# Patient Record
Sex: Female | Born: 1985 | Race: White | Hispanic: No | Marital: Married | Smoking: Never smoker
Health system: Southern US, Community
[De-identification: ages and names within clinical notes are randomized; demographics above are authoritative.]

## PROBLEM LIST (undated history)

## (undated) DIAGNOSIS — F329 Major depressive disorder, single episode, unspecified: Secondary | ICD-10-CM

## (undated) DIAGNOSIS — E876 Hypokalemia: Secondary | ICD-10-CM

## (undated) DIAGNOSIS — IMO0002 Reserved for concepts with insufficient information to code with codable children: Secondary | ICD-10-CM

## (undated) DIAGNOSIS — B002 Herpesviral gingivostomatitis and pharyngotonsillitis: Secondary | ICD-10-CM

## (undated) DIAGNOSIS — G43909 Migraine, unspecified, not intractable, without status migrainosus: Secondary | ICD-10-CM

## (undated) DIAGNOSIS — R87619 Unspecified abnormal cytological findings in specimens from cervix uteri: Secondary | ICD-10-CM

## (undated) DIAGNOSIS — F32A Depression, unspecified: Secondary | ICD-10-CM

## (undated) HISTORY — DX: Hypomagnesemia: E83.42

## (undated) HISTORY — PX: COLPOSCOPY: SHX161

## (undated) HISTORY — DX: Hypokalemia: E87.6

## (undated) HISTORY — PX: TONSILLECTOMY: SUR1361

---

## 2006-09-22 ENCOUNTER — Ambulatory Visit: Payer: Self-pay | Admitting: Unknown Physician Specialty

## 2012-03-29 ENCOUNTER — Ambulatory Visit: Payer: Self-pay | Admitting: Family Medicine

## 2012-11-07 ENCOUNTER — Inpatient Hospital Stay: Payer: Self-pay

## 2012-11-07 LAB — CBC WITH DIFFERENTIAL/PLATELET
Basophil %: 0.3 %
Eosinophil #: 0.1 10*3/uL (ref 0.0–0.7)
Eosinophil %: 0.5 %
HGB: 13 g/dL (ref 12.0–16.0)
Lymphocyte #: 2.3 10*3/uL (ref 1.0–3.6)
MCH: 30.8 pg (ref 26.0–34.0)
MCHC: 33.5 g/dL (ref 32.0–36.0)
MCV: 92 fL (ref 80–100)
Monocyte #: 0.9 x10 3/mm (ref 0.2–0.9)
Neutrophil %: 71.6 %
RDW: 14 % (ref 11.5–14.5)

## 2014-10-16 LAB — OB RESULTS CONSOLE ABO/RH: RH Type: POSITIVE

## 2014-10-16 LAB — OB RESULTS CONSOLE HEPATITIS B SURFACE ANTIGEN: Hepatitis B Surface Ag: NEGATIVE

## 2014-10-16 LAB — OB RESULTS CONSOLE PLATELET COUNT: PLATELETS: 222 10*3/uL

## 2014-10-16 LAB — OB RESULTS CONSOLE VARICELLA ZOSTER ANTIBODY, IGG: Varicella: IMMUNE

## 2014-10-16 LAB — OB RESULTS CONSOLE HIV ANTIBODY (ROUTINE TESTING): HIV: NONREACTIVE

## 2014-10-16 LAB — OB RESULTS CONSOLE RPR: RPR: NONREACTIVE

## 2014-10-16 LAB — OB RESULTS CONSOLE HGB/HCT, BLOOD: Hemoglobin: 13.1 g/dL

## 2014-10-16 LAB — OB RESULTS CONSOLE GC/CHLAMYDIA
Chlamydia: NEGATIVE
Gonorrhea: NEGATIVE

## 2014-10-16 LAB — OB RESULTS CONSOLE RUBELLA ANTIBODY, IGM: RUBELLA: IMMUNE

## 2014-12-05 NOTE — L&D Delivery Note (Signed)
Obstetrical Delivery Note   Date of Delivery:   05/05/2015 Primary OB:   [facility] Gestational Age/EDD: 5186w2d (Dated by LMP c/w US) Antepartum complications: none  Delivered By:   Ranae Plumberhelsea Denver Harder, MD  Delivery Type:   spontaneous vaginal delivery  Procedure Details:   Patient was complete and labored down for a short time.  She began pushing and delivered through an intact perineum a liveborn female infant, with details below.  Delayed cord clamping was performed while baby was placed on maternal chest.  Cord was clamped and cut and newborn brought to warmer for deep suctioning. Small abrasion at posterior fourchette, no repair.  A portion of cord was collected in case of need for cord gas, but not used.  Placenta delivered with massage, intact.          Anesthesia:    epidural Intrapartum complications: None GBS:    Negative Laceration:    none Episiotomy:    none Placenta:    Via active 3rd stage. Intact: yes. To pathology: no.  Estimated Blood Loss:  350cc  Baby:    Liveborn female, APGARs 7/9, weight 2950gm (6lb 8oz).  Loose Body cord.  Ranae Plumberhelsea Bobby Barton, MD [facility]

## 2015-04-14 NOTE — H&P (Signed)
L&D Evaluation:  History Expanded:   HPI 29 yo G1 with EDD of 12/01/12, presents at 36 4/7 weeks with SROM at 1530 today. Pt denies feeling regular contractions, no VB, +FM. [facility].    Blood Type (Maternal) A positive    Group B Strep Results Maternal (Result >5wks must be treated as unknown) negative    Maternal HIV Negative    Maternal Syphilis Ab Nonreactive    Maternal Varicella Non-Immune    Rubella Results (Maternal) immune    Maternal T-Dap Immune    Patient's Medical History No Chronic Illness    Patient's Surgical History tonsillectomy    Medications Pre Natal Vitamins  mucinex    Allergies NKDA    Social History none   ROS:   ROS see HPI   Exam:   Vital Signs stable    General no apparent distress    Mental Status clear    Abdomen gravid, tender with contractions, beginning to feel contractions    Estimated Fetal Weight Average for gestational age    Edema no edema    Pelvic no external lesions, 2/60/-1    Mebranes Ruptured    Description clear    FHT normal rate with no decels, BL 150, moderate variability, + accelerations    Ucx regular, 3-5 min   Impression:   Impression SROM, early labor   Plan:   Plan monitor contractions and for cervical change    Comments May ambulate in hallways if desired IV medication as desired Epidural when indicated   Electronic Signatures: Vella KohlerBrothers, Derrick Orris K (CNM)  (Signed 04-Dec-13 17:17)  Authored: L&D Evaluation   Last Updated: 04-Dec-13 17:17 by Vella KohlerBrothers, Ludia Gartland K (CNM)

## 2015-04-29 LAB — OB RESULTS CONSOLE GBS
GBS: NEGATIVE
STREP GROUP B AG: NEGATIVE
STREP GROUP B AG: NEGATIVE

## 2015-05-05 ENCOUNTER — Inpatient Hospital Stay
Admission: EM | Admit: 2015-05-05 | Discharge: 2015-05-07 | DRG: 775 | Disposition: A | Discharge status: Home / Self Care | Payer: BLUE CROSS/BLUE SHIELD | Attending: Obstetrics & Gynecology | Admitting: Obstetrics & Gynecology

## 2015-05-05 ENCOUNTER — Inpatient Hospital Stay: Payer: BLUE CROSS/BLUE SHIELD | Admitting: Certified Registered"

## 2015-05-05 DIAGNOSIS — IMO0001 Reserved for inherently not codable concepts without codable children: Secondary | ICD-10-CM

## 2015-05-05 DIAGNOSIS — F329 Major depressive disorder, single episode, unspecified: Secondary | ICD-10-CM | POA: Diagnosis present

## 2015-05-05 DIAGNOSIS — Z3A37 37 weeks gestation of pregnancy: Secondary | ICD-10-CM | POA: Diagnosis present

## 2015-05-05 DIAGNOSIS — O99344 Other mental disorders complicating childbirth: Principal | ICD-10-CM | POA: Diagnosis present

## 2015-05-05 DIAGNOSIS — O09213 Supervision of pregnancy with history of pre-term labor, third trimester: Secondary | ICD-10-CM

## 2015-05-05 HISTORY — DX: Major depressive disorder, single episode, unspecified: F32.9

## 2015-05-05 HISTORY — DX: Migraine, unspecified, not intractable, without status migrainosus: G43.909

## 2015-05-05 HISTORY — DX: Unspecified abnormal cytological findings in specimens from cervix uteri: R87.619

## 2015-05-05 HISTORY — DX: Reserved for concepts with insufficient information to code with codable children: IMO0002

## 2015-05-05 HISTORY — DX: Depression, unspecified: F32.A

## 2015-05-05 HISTORY — DX: Herpesviral gingivostomatitis and pharyngotonsillitis: B00.2

## 2015-05-05 LAB — CBC
HCT: 40 % (ref 35.0–47.0)
Hemoglobin: 12.9 g/dL (ref 12.0–16.0)
MCH: 29.1 pg (ref 26.0–34.0)
MCHC: 32.1 g/dL (ref 32.0–36.0)
MCV: 90.5 fL (ref 80.0–100.0)
Platelets: 236 10*3/uL (ref 150–440)
RBC: 4.42 MIL/uL (ref 3.80–5.20)
RDW: 14.4 % (ref 11.5–14.5)
WBC: 10.3 10*3/uL (ref 3.6–11.0)

## 2015-05-05 LAB — TYPE AND SCREEN
ABO/RH(D): A POS
Antibody Screen: NEGATIVE

## 2015-05-05 LAB — ABO/RH: ABO/RH(D): A POS

## 2015-05-05 MED ORDER — BUTORPHANOL TARTRATE 1 MG/ML IJ SOLN: 1.0000 mg | INTRAMUSCULAR | Status: DC | PRN

## 2015-05-05 MED ORDER — SERTRALINE HCL 50 MG PO TABS
50.0000 mg | ORAL_TABLET | Freq: Every day | ORAL | Status: DC
  Filled 2015-05-05 (×2): qty 1

## 2015-05-05 MED ORDER — OXYTOCIN 10 UNIT/ML IJ SOLN
INTRAMUSCULAR | Status: AC
  Filled 2015-05-05: qty 2

## 2015-05-05 MED ORDER — OXYTOCIN 40 UNITS IN LACTATED RINGERS INFUSION - SIMPLE MED: 62.5000 mL/h | INTRAVENOUS | Status: DC

## 2015-05-05 MED ORDER — LANOLIN HYDROUS EX OINT: TOPICAL_OINTMENT | CUTANEOUS | Status: DC | PRN

## 2015-05-05 MED ORDER — LACTATED RINGERS IV SOLN
INTRAVENOUS | Status: DC
  Administered 2015-05-05 (×2): via INTRAVENOUS

## 2015-05-05 MED ORDER — LIDOCAINE HCL (PF) 1 % IJ SOLN
INTRAMUSCULAR | Status: AC
  Filled 2015-05-05: qty 30

## 2015-05-05 MED ORDER — IBUPROFEN 600 MG PO TABS
600.0000 mg | ORAL_TABLET | Freq: Four times a day (QID) | ORAL | Status: DC
  Administered 2015-05-06 – 2015-05-07 (×6): 600 mg via ORAL
  Filled 2015-05-05 (×6): qty 1

## 2015-05-05 MED ORDER — OXYTOCIN 40 UNITS IN LACTATED RINGERS INFUSION - SIMPLE MED
INTRAVENOUS | Status: AC
  Filled 2015-05-05: qty 1000

## 2015-05-05 MED ORDER — ONDANSETRON HCL 4 MG/2ML IJ SOLN: 4.0000 mg | INTRAMUSCULAR | Status: DC | PRN

## 2015-05-05 MED ORDER — IBUPROFEN 600 MG PO TABS
600.0000 mg | ORAL_TABLET | Freq: Four times a day (QID) | ORAL | Status: DC
Start: 2015-05-06 — End: 2015-05-05
  Administered 2015-05-05: 600 mg via ORAL
  Filled 2015-05-05: qty 1

## 2015-05-05 MED ORDER — BUPIVACAINE HCL (PF) 0.25 % IJ SOLN
INTRAMUSCULAR | Status: DC | PRN
  Administered 2015-05-05: 5 mL

## 2015-05-05 MED ORDER — PRENATAL MULTIVITAMIN CH
1.0000 | ORAL_TABLET | Freq: Every day | ORAL | Status: DC
  Administered 2015-05-06 – 2015-05-07 (×2): 1 via ORAL
  Filled 2015-05-05 (×2): qty 1

## 2015-05-05 MED ORDER — BENZOCAINE-MENTHOL 20-0.5 % EX AERO: 1.0000 "application " | INHALATION_SPRAY | CUTANEOUS | Status: DC | PRN

## 2015-05-05 MED ORDER — DIPHENHYDRAMINE HCL 25 MG PO CAPS: 25.0000 mg | ORAL_CAPSULE | Freq: Four times a day (QID) | ORAL | Status: DC | PRN

## 2015-05-05 MED ORDER — AMMONIA AROMATIC IN INHA
RESPIRATORY_TRACT | Status: AC
  Filled 2015-05-05: qty 10

## 2015-05-05 MED ORDER — WITCH HAZEL-GLYCERIN EX PADS: 1.0000 "application " | MEDICATED_PAD | CUTANEOUS | Status: DC | PRN

## 2015-05-05 MED ORDER — SIMETHICONE 80 MG PO CHEW: 80.0000 mg | CHEWABLE_TABLET | ORAL | Status: DC | PRN

## 2015-05-05 MED ORDER — MISOPROSTOL 200 MCG PO TABS
ORAL_TABLET | ORAL | Status: AC
  Filled 2015-05-05: qty 4

## 2015-05-05 MED ORDER — FENTANYL 2.5 MCG/ML W/ROPIVACAINE 0.2% IN NS 100 ML EPIDURAL INFUSION (ARMC-ANES)
EPIDURAL | Status: AC
  Administered 2015-05-05: 9 mL/h via EPIDURAL
  Filled 2015-05-05: qty 100

## 2015-05-05 MED ORDER — OXYTOCIN BOLUS FROM INFUSION: 500.0000 mL | INTRAVENOUS | Status: DC

## 2015-05-05 MED ORDER — LIDOCAINE HCL (PF) 1 % IJ SOLN
30.0000 mL | INTRAMUSCULAR | Status: DC | PRN
  Filled 2015-05-05: qty 30

## 2015-05-05 MED ORDER — ONDANSETRON HCL 4 MG/2ML IJ SOLN: 4.0000 mg | Freq: Four times a day (QID) | INTRAMUSCULAR | Status: DC | PRN

## 2015-05-05 MED ORDER — OXYTOCIN 10 UNIT/ML IJ SOLN: 10.0000 [IU] | Freq: Once | INTRAMUSCULAR | Status: DC | PRN

## 2015-05-05 MED ORDER — ONDANSETRON HCL 4 MG PO TABS: 4.0000 mg | ORAL_TABLET | ORAL | Status: DC | PRN

## 2015-05-05 MED ORDER — LACTATED RINGERS IV SOLN: 500.0000 mL | INTRAVENOUS | Status: DC | PRN

## 2015-05-05 MED ORDER — ACETAMINOPHEN 325 MG PO TABS: 650.0000 mg | ORAL_TABLET | ORAL | Status: DC | PRN

## 2015-05-05 MED ORDER — ACETAMINOPHEN 325 MG PO TABS
650.0000 mg | ORAL_TABLET | ORAL | Status: DC | PRN
  Administered 2015-05-06 – 2015-05-07 (×6): 650 mg via ORAL
  Filled 2015-05-05 (×6): qty 2

## 2015-05-05 MED ORDER — SERTRALINE HCL 50 MG PO TABS
50.0000 mg | ORAL_TABLET | Freq: Every day | ORAL | Status: DC
  Administered 2015-05-05 – 2015-05-06 (×2): 50 mg via ORAL

## 2015-05-05 MED ORDER — FENTANYL 2.5 MCG/ML W/ROPIVACAINE 0.2% IN NS 100 ML EPIDURAL INFUSION (ARMC-ANES)
2.0000 mL/h | EPIDURAL | Status: DC
  Administered 2015-05-05: 9 mL/h via EPIDURAL

## 2015-05-05 MED ORDER — DIBUCAINE 1 % RE OINT: 1.0000 "application " | TOPICAL_OINTMENT | RECTAL | Status: DC | PRN

## 2015-05-05 NOTE — Progress Notes (Signed)
Intrapartum progress note FHT rising baseline to 140, mod variability, no accels, + variable. Cervical exam: 10/100/+2 Patient does not feel contractions or urge to push with strong epidural.  Will turn down epidural rate and labor down for now. Continue to monitor closely.  CW

## 2015-05-05 NOTE — H&P (Signed)
Obstetric History and Physical  Sheri Ruiz is a 29 y.o. G2P0101 with IUP at 2967w2d presenting for SROM around 6:30am this morning. Patient states she has been having  some contractions that have been picking up in intensity, minimal vaginal bleeding, ruptured, clear fluid membranes, with active fetal movement.    Prenatal Course Source of Care: [facility]  with onset of care at 9 weeks Pregnancy complications or risks:  History of a preterm delivery in 2013 at 4663w5d. Cervical lengths have been stable, hx of depression on zoloft this pregnancy, failed 1 hr gtt with all 3 normal values on 3 hour.  Patient Active Problem List   Diagnosis Date Noted  . Labor and delivery, indication for care 05/05/2015   She plans to breastfeed She desires vasectomy for postpartum contraception.   Prenatal labs and studies: ABO, Rh: A/Positive/-- (11/12 0000) Antibody:   Rubella: Immune (11/12 0000) Varicella: immune RPR: Nonreactive (11/12 0000)  HBsAg: Negative (11/12 0000)  HIV: Non-reactive (11/12 0000)  XLK:GMWNUUVOGBS:Negative 1 hr Glucola  141 Genetic screening NA Anatomy US normal Tdap: given   Flu: not given    Past Medical History  Diagnosis Date  . Preterm delivery   . Depression   . Oral herpes   . Abnormal Pap smear of cervix   . LGSIL (low grade squamous intraepithelial dysplasia)   . Migraine     Past Surgical History  Procedure Laterality Date  . Colposcopy    . Tonsillectomy      OB History  Gravida Para Term Preterm AB SAB TAB Ectopic Multiple Living  2 1 0 1 0 0 0 0 0 1     # Outcome Date GA Lbr Len/2nd Weight Sex Delivery Anes PTL Lv  2 Current           1 Preterm 2013 7563w5d   M          History   Social History  . Marital Status: Single    Spouse Name: N/A  . Number of Children: N/A  . Years of Education: N/A   Social History Main Topics  . Smoking status: Never Smoker   . Smokeless tobacco: Not on file  . Alcohol Use: No  . Drug Use: No  . Sexual Activity:  Yes    Birth Control/ Protection: None   Other Topics Concern  . None   Social History Narrative  . None    History reviewed. No pertinent family history.  Prescriptions prior to admission  Medication Sig Dispense Refill Last Dose  . Prenatal Vit-Fe Fumarate-FA (MULTIVITAMIN-PRENATAL) 27-0.8 MG TABS tablet Take 1 tablet by mouth daily at 12 noon.   05/05/2015 at Unknown time  . sertraline (ZOLOFT) 50 MG tablet Take 50 mg by mouth daily.   05/04/2015 at Unknown time  . valACYclovir (VALTREX) 1000 MG tablet Take 1,000 mg by mouth 2 (two) times daily.     . valACYclovir (VALTREX) 500 MG tablet Take 500 mg by mouth.     pt not currently taking valtrex at the moment. Takes them PRN for cold sores.   Allergies  Allergen Reactions  . Amoxicillin Rash    Review of Systems: Negative except for what is mentioned in HPI.  Physical Exam: BP 116/65 mmHg  Pulse 80  Temp(Src) 98.2 F (36.8 C) (Oral)  LMP 08/17/2014 (Approximate) GENERAL: Well-developed, well-nourished female in no acute distress.  LUNGS: Clear to auscultation bilaterally.  HEART: Regular rate and rhythm. ABDOMEN: Soft, nontender, nondistended, gravid. EXTREMITIES: Nontender, no edema,  2+ distal pulses. Dilation: 3.5 Station: -2 Presentation: Vertex Exam by:: c Angelos Wasco  3.5/60/-2  Grossly ruptured for clear fluid Presentation: cephalic FHT:  Baseline rate 145 bpm   Variability moderate  Accelerations present   Decelerations none Contractions: Every 2-3  mins  Pertinent Labs/Studies:   No results found for this or any previous visit (from the past 24 hour(s)).  Assessment : Sheri Ruiz is a 29 y.o. G2P0101 at [redacted]w[redacted]d being admitted for labor and SROM at 0630 am.  Plan: Labor: Expectant management.  Induction/Augmentation as needed, per protocol FWB: Reassuring fetal heart tracing.   GBS negative Pt may possibly want epidural- IV stadol as well for pain  Delivery plan: Hopeful for vaginal  delivery  Jannet Mantis, CNM [facility]

## 2015-05-05 NOTE — OB Triage Note (Signed)
Pt presents to l/d with c/o ruptured membranes at 0600 this am. Pt placed on efm and pt checked with nitrazine . nitrazine negative. Courtney subudhi,cnm notofied

## 2015-05-05 NOTE — Anesthesia Procedure Notes (Signed)
Epidural Patient location during procedure: OB Start time: 05/05/2015 2:45 PM End time: 05/05/2015 3:17 PM  Staffing Anesthesiologist: Yves DillARROLL, PAUL Resident/CRNA: Mathews ArgyleLOGAN, Dent Plantz Performed by: resident/CRNA   Preanesthetic Checklist Completed: patient identified, site marked, pre-op evaluation, timeout performed, IV checked, risks and benefits discussed and monitors and equipment checked  Epidural Patient position: sitting Prep: Betadine and site prepped and draped Patient monitoring: heart rate, cardiac monitor, continuous pulse ox and blood pressure Approach: midline Location: L3-L4 Injection technique: LOR saline  Needle:  Needle type: Tuohy  Needle gauge: 18 G Needle length: 9 cm Needle insertion depth: 6 cm Catheter type: closed end flexible Catheter size: 20 Guage Catheter at skin depth: 11 cm Test dose: negative and 1.5% lidocaine with Epi 1:200 K  Assessment Events: blood not aspirated, injection not painful, no injection resistance, negative IV test and no paresthesia  Additional Notes Reason for block:procedure for pain

## 2015-05-05 NOTE — Anesthesia Preprocedure Evaluation (Addendum)
Anesthesia Evaluation  Patient identified by MRN, date of birth, ID band Patient awake    Reviewed: Allergy & Precautions, NPO status , Patient's Chart, lab work & pertinent test results  Airway Mallampati: I  TM Distance: >3 FB Neck ROM: Full    Dental  (+) Teeth Intact   Pulmonary neg pulmonary ROS,    Pulmonary exam normal       Cardiovascular negative cardio ROS Normal cardiovascular examRhythm:Regular Rate:Normal     Neuro/Psych  Headaches, Depression    GI/Hepatic Neg liver ROS, GERD-  Controlled,  Endo/Other  negative endocrine ROS  Renal/GU negative Renal ROS  negative genitourinary   Musculoskeletal negative musculoskeletal ROS (+)   Abdominal Normal abdominal exam  (+)   Peds  Hematology negative hematology ROS (+)   Anesthesia Other Findings   Reproductive/Obstetrics negative OB ROS                            Anesthesia Physical Anesthesia Plan  ASA: II  Anesthesia Plan: Epidural   Post-op Pain Management:    Induction:   Airway Management Planned:   Additional Equipment:   Intra-op Plan:   Post-operative Plan:   Informed Consent: I have reviewed the patients History and Physical, chart, labs and discussed the procedure including the risks, benefits and alternatives for the proposed anesthesia with the patient or authorized representative who has indicated his/her understanding and acceptance.     Plan Discussed with:   Anesthesia Plan Comments:         Anesthesia Quick Evaluation

## 2015-05-05 NOTE — Discharge Summary (Signed)
Obstetrical Discharge Summary  Date of Admission: 05/05/2015 Date of Discharge: 05/05/2015  Primary OB: [facility]  Gestational Age at Delivery: 8132w2d   Antepartum complications: HSV suppression, depression taking zoloft Reason for Admission: SROM Date of Delivery: 05/05/2015 Delivered By: Ranae Plumberhelsea Ward, MD Delivery Type: spontaneous vaginal delivery Intrapartum complications/course: Expectant management, epidural, Category 2 tracing with variables. Anesthesia: epidural Placenta: spontaneous Laceration: none Episiotomy: none Baby: Liveborn female, APGARs7/9, weight 2950 g. (6lb 8oz)   Post partum course: Uncomplicated postpartum course Disposition: Home  Rh Immune globulin given: no Rubella vaccine given: no Tdap vaccine given in AP or PP setting: yes Flu vaccine given in AP or PP setting: yes  Feeding: breast Contraception:vasectomy  Prenatal Labs:  A+, RI, VI, HbsAg neg, HIV neg, RPR nr, GBS neg   Plan:  Sheri Ruiz was discharged to home in good condition. [facility] with Dr. Elesa MassedWard in 1 week  No future appointments.  Discharge Medications:   Medication List    ASK your doctor about these medications        multivitamin-prenatal 27-0.8 MG Tabs tablet  Take 1 tablet by mouth daily at 12 noon.     sertraline 50 MG tablet  Commonly known as:  ZOLOFT  Take 50 mg by mouth daily.     valACYclovir 1000 MG tablet  Commonly known as:  VALTREX  Take 1,000 mg by mouth 2 (two) times daily.     valACYclovir 500 MG tablet  Commonly known as:  VALTREX  Take 500 mg by mouth.        Ranae Plumberhelsea Ward, MD Attending Obstetrician/Gynecologist [facility]

## 2015-05-05 NOTE — Progress Notes (Signed)
Intrapartum progress note  Patient comfortable, with epidural.  Mild nausea.  No other complaints.  VS WNL. FHT: 135,mod, +accels + variables Toco: q2-535mins, no pit Cervix: 6-7/90/-1 per RN  A/P:  28yo G2P0101 @ 37.2 with SROM 1. FHT shows no signs of fetal compromise or acidemia as variability is moderate and presence of accelerations. Category 2 strip, no need for intervention at this time. 2. Continue expectant management  Ranae Plumberhelsea Johnluke Haugen, MD Attending Obstetrician and Gynecologist [facility]

## 2015-05-06 LAB — CBC
HEMATOCRIT: 37.8 % (ref 35.0–47.0)
HEMOGLOBIN: 12.5 g/dL (ref 12.0–16.0)
MCH: 29.8 pg (ref 26.0–34.0)
MCHC: 32.9 g/dL (ref 32.0–36.0)
MCV: 90.4 fL (ref 80.0–100.0)
Platelets: 220 10*3/uL (ref 150–440)
RBC: 4.18 MIL/uL (ref 3.80–5.20)
RDW: 14.2 % (ref 11.5–14.5)
WBC: 13.8 10*3/uL — ABNORMAL HIGH (ref 3.6–11.0)

## 2015-05-06 LAB — RPR: RPR Ser Ql: NONREACTIVE

## 2015-05-06 NOTE — Progress Notes (Signed)
Post Partum Day 1 from a SVD Subjective: no complaints, up ad lib, voiding, tolerating PO and breastfeeding going well.   Objective: Filed Vitals:   05/05/15 2145 05/05/15 2310 05/06/15 0310 05/06/15 0723  BP:  116/63 95/54 104/63  Pulse:  90 91 95  Temp:  98.3 F (36.8 C) 97.8 F (36.6 C) 97.4 F (36.3 C)  TempSrc:  Oral Oral Oral  Resp:  18 18 14   Height: 5\' 2"  (1.575 m)     Weight: 88.451 kg (195 lb)     SpO2:    100%   Physical Exam:  General: alert, cooperative and no distress Lochia: appropriate Uterine Fundus: firm Incision: NA DVT Evaluation: No evidence of DVT seen on physical exam.   Recent Labs  05/05/15 1115 05/06/15 0616  HGB 12.9 12.5  HCT 40.0 37.8    Assessment/Plan: Plan for discharge tomorrow and Breastfeeding   LOS: 1 day   Jannet MantisSubudhi,  Kaytlen Lightsey 05/06/2015, 9:38 AM

## 2015-05-06 NOTE — Anesthesia Postprocedure Evaluation (Signed)
  Anesthesia Post-op Note  Patient: Sheri KosKristyn S Ruiz  Procedure(s) Performed: * No procedures listed *  Patient Location: Mother/Baby 338  Anesthesia Type:Epidural  Level of Consciousness: awake, alert  and oriented  Airway and Oxygen Therapy: Patient Spontanous Breathing  Post-op Pain: 0 /10  Post-op Assessment: Post-op Vital signs reviewed, Patent Airway, No signs of Nausea or vomiting and Pain level controlled  Post-op Vital Signs: Reviewed and stable  Last Vitals:  Filed Vitals:   05/06/15 0723  BP: 104/63  Pulse: 95  Temp: 36.3 C  Resp: 14    Complications: No apparent anesthesia complications

## 2015-05-07 MED ORDER — IBUPROFEN 600 MG PO TABS: 600.0000 mg | ORAL_TABLET | Freq: Four times a day (QID) | ORAL | Status: DC

## 2015-05-07 NOTE — Progress Notes (Signed)
Pt discharged home with infant. Discharge teaching completed.  

## 2016-02-04 LAB — HM PAP SMEAR: HM PAP: NORMAL

## 2016-04-01 ENCOUNTER — Emergency Department
Admission: EM | Admit: 2016-04-01 | Discharge: 2016-04-01 | Disposition: A | Discharge status: Home / Self Care | Payer: BLUE CROSS/BLUE SHIELD | Attending: Emergency Medicine | Admitting: Emergency Medicine

## 2016-04-01 ENCOUNTER — Emergency Department: Payer: BLUE CROSS/BLUE SHIELD

## 2016-04-01 ENCOUNTER — Encounter: Payer: Self-pay | Admitting: Emergency Medicine

## 2016-04-01 DIAGNOSIS — Z791 Long term (current) use of non-steroidal anti-inflammatories (NSAID): Secondary | ICD-10-CM | POA: Diagnosis not present

## 2016-04-01 DIAGNOSIS — R0789 Other chest pain: Secondary | ICD-10-CM | POA: Diagnosis not present

## 2016-04-01 DIAGNOSIS — R079 Chest pain, unspecified: Secondary | ICD-10-CM

## 2016-04-01 DIAGNOSIS — R51 Headache: Secondary | ICD-10-CM

## 2016-04-01 DIAGNOSIS — Z79899 Other long term (current) drug therapy: Secondary | ICD-10-CM | POA: Diagnosis not present

## 2016-04-01 DIAGNOSIS — R519 Headache, unspecified: Secondary | ICD-10-CM

## 2016-04-01 DIAGNOSIS — F329 Major depressive disorder, single episode, unspecified: Secondary | ICD-10-CM | POA: Diagnosis not present

## 2016-04-01 MED ORDER — DIPHENHYDRAMINE HCL 50 MG/ML IJ SOLN
25.0000 mg | Freq: Once | INTRAMUSCULAR | Status: AC
  Administered 2016-04-01: 25 mg via INTRAVENOUS
  Filled 2016-04-01: qty 1

## 2016-04-01 MED ORDER — GI COCKTAIL ~~LOC~~
30.0000 mL | Freq: Once | ORAL | Status: AC
  Administered 2016-04-01: 30 mL via ORAL
  Filled 2016-04-01: qty 30

## 2016-04-01 MED ORDER — BUTALBITAL-APAP-CAFFEINE 50-325-40 MG PO TABS: 1.0000 | ORAL_TABLET | Freq: Four times a day (QID) | ORAL | Status: AC | PRN

## 2016-04-01 MED ORDER — METOCLOPRAMIDE HCL 5 MG/ML IJ SOLN
20.0000 mg | Freq: Once | INTRAVENOUS | Status: AC
  Administered 2016-04-01: 20 mg via INTRAVENOUS
  Filled 2016-04-01 (×2): qty 4

## 2016-04-01 NOTE — Discharge Instructions (Signed)

## 2016-04-01 NOTE — ED Notes (Signed)
Patient transported to CT 

## 2016-04-01 NOTE — ED Notes (Signed)
Pt. States [facility] for "shooting pain in the back of head".  Pt. States she [facility] and after shot pt. States tingling sensation to both arms and across chest.  Pt. States hx of Migraines.

## 2016-04-01 NOTE — ED Provider Notes (Signed)
[facility] Emergency Department Provider Note  ____________________________________________    I have reviewed the triage vital signs and the nursing notes.   HISTORY  Chief Complaint Chest Pain    HPI Sheri Ruiz is a 30 y.o. female who presents with chest pain. Patient reports that she went to go to clinic walk-in for migraine symptoms. She reports a history of migraines and reports her symptoms were similar. They gave her a shot of Toradol IM and approximately 15 minutes after getting the shot she felt like she was having a panic attack and felt tightness in her chest. She came to the emergency department and is feeling definitely better now although she continues to have some discomfort in her head posteriorly. This is typically where her migraine symptoms manifest. No neuro deficits. No fevers. No neck pain     Past Medical History  Diagnosis Date  . Preterm delivery   . Depression   . Oral herpes   . Abnormal Pap smear of cervix   . LGSIL (low grade squamous intraepithelial dysplasia)   . Migraine     Patient Active Problem List   Diagnosis Date Noted  . Labor and delivery, indication for care 05/05/2015  . Status post normal vaginal delivery 05/05/2015    Past Surgical History  Procedure Laterality Date  . Colposcopy    . Tonsillectomy      Current Outpatient Rx  Name  Route  Sig  Dispense  Refill  . butalbital-acetaminophen-caffeine (FIORICET) 50-325-40 MG tablet   Oral   Take 1-2 tablets by mouth every 6 (six) hours as needed for headache.   20 tablet   0   . ibuprofen (ADVIL,MOTRIN) 600 MG tablet   Oral   Take 1 tablet (600 mg total) by mouth every 6 (six) hours.   30 tablet   0   . Prenatal Vit-Fe Fumarate-FA (MULTIVITAMIN-PRENATAL) 27-0.8 MG TABS tablet   Oral   Take 1 tablet by mouth daily.          . sertraline (ZOLOFT) 50 MG tablet   Oral   Take 50 mg by mouth daily.            Allergies Amoxicillin  Family History  Problem Relation Age of Onset  . Stroke Mother     Social History Social History  Substance Use Topics  . Smoking status: Never Smoker   . Smokeless tobacco: None  . Alcohol Use: No    Review of Systems  Constitutional: Negative for fever. Eyes: Negative for redness ENT: Negative for neck pain Cardiovascular: As above Respiratory: Negative for shortness of breath. Gastrointestinal: Negative for abdominal pain Genitourinary: Negative for dysuria. Musculoskeletal: Negative for back pain. Skin: Negative for rash. Neurological: Negative for focal weakness, no dizziness Psychiatric: no anxiety    ____________________________________________   PHYSICAL EXAM:  VITAL SIGNS: ED Triage Vitals  Enc Vitals Group     BP 04/01/16 1748 120/71 mmHg     Pulse Rate 04/01/16 1748 77     Resp 04/01/16 1748 18     Temp 04/01/16 1748 97.7 F (36.5 C)     Temp Source 04/01/16 1748 Oral     SpO2 04/01/16 1748 100 %     Weight 04/01/16 1748 171 lb (77.565 kg)     Height 04/01/16 1748 5\' 2"  (1.575 m)     Head Cir --      Peak Flow --      Pain Score 04/01/16 1752 4  Pain Loc --      Pain Edu? --      Excl. in GC? --      Constitutional: Alert and oriented. Well appearing and in no distress.  Eyes: Conjunctivae are normal. No erythema or injection, PERRLA, EOMI ENT   Head: Normocephalic and atraumatic.   Mouth/Throat: Mucous membranes are moist. No meningeal signs Cardiovascular: Normal rate, regular rhythm. Normal and symmetric distal pulses are present in the upper extremities.  Respiratory: Normal respiratory effort without tachypnea nor retractions. Breath sounds are clear and equal bilaterally.  Gastrointestinal: Soft and non-tender in all quadrants. No distention. There is no CVA tenderness. Genitourinary: deferred Musculoskeletal: Nontender with normal range of motion in all extremities. No lower extremity tenderness  nor edema. Neurologic:  Normal speech and language. No gross focal neurologic deficits are appreciated. Skin:  Skin is warm, dry and intact. No rash noted. Psychiatric: Mood and affect are normal. Patient exhibits appropriate insight and judgment.  ____________________________________________    LABS (pertinent positives/negatives)  Labs Reviewed - No data to display  ____________________________________________   EKG  ED ECG REPORT I, Jene Every, the attending physician, personally viewed and interpreted this ECG.  Date: 04/01/2016 EKG Time: 5:48 PM Rate: 79 Rhythm: normal sinus rhythm QRS Axis: normal Intervals: normal ST/T Wave abnormalities: normal Conduction Disturbances: none Narrative Interpretation: unremarkable   ____________________________________________    RADIOLOGY  CT head unremarkable  ____________________________________________   PROCEDURES  Procedure(s) performed: none  Critical Care performed: none  ____________________________________________   INITIAL IMPRESSION / ASSESSMENT AND PLAN / ED COURSE  Pertinent labs & imaging results that were available during my care of the patient were reviewed by me and considered in my medical decision making (see chart for details).  Patient had resolution of chest pain after GI cocktail. Her EKG is unremarkable. We will give IV Reglan and Benadryl for her migraine and obtain CT head.  CT is unremarkable. Patient is feeling significantly better after treatment. She is anxious to go home. I feel that this is reasonable given no neuro deficits, no fever, no meningeal signs. Symptoms consistent with her typical migraine. Return precautions discussed.  ____________________________________________   FINAL CLINICAL IMPRESSION(S) / ED DIAGNOSES  Final diagnoses:  Acute nonintractable headache, unspecified headache type  Chest pain, unspecified chest pain type          Jene Every,  MD 04/01/16 2246

## 2016-04-01 NOTE — ED Notes (Signed)
Pharmacy called regarding Reglan, will send to ED.

## 2016-07-20 ENCOUNTER — Encounter: Payer: Self-pay | Admitting: Primary Care

## 2016-07-20 ENCOUNTER — Ambulatory Visit (INDEPENDENT_AMBULATORY_CARE_PROVIDER_SITE_OTHER): Payer: BLUE CROSS/BLUE SHIELD | Admitting: Primary Care

## 2016-07-20 VITALS — BP 118/72 | HR 75 | Temp 97.6°F | Ht 63.0 in | Wt 180.0 lb

## 2016-07-20 DIAGNOSIS — N943 Premenstrual tension syndrome: Secondary | ICD-10-CM | POA: Diagnosis not present

## 2016-07-20 DIAGNOSIS — B001 Herpesviral vesicular dermatitis: Secondary | ICD-10-CM | POA: Insufficient documentation

## 2016-07-20 DIAGNOSIS — F411 Generalized anxiety disorder: Secondary | ICD-10-CM

## 2016-07-20 DIAGNOSIS — G43821 Menstrual migraine, not intractable, with status migrainosus: Secondary | ICD-10-CM

## 2016-07-20 DIAGNOSIS — G43909 Migraine, unspecified, not intractable, without status migrainosus: Secondary | ICD-10-CM | POA: Insufficient documentation

## 2016-07-20 MED ORDER — HYDROXYZINE HCL 25 MG PO TABS: 25.0000 mg | ORAL_TABLET | Freq: Two times a day (BID) | ORAL | 0 refills | Status: DC | PRN

## 2016-07-20 MED ORDER — SERTRALINE HCL 100 MG PO TABS
100.0000 mg | ORAL_TABLET | Freq: Every day | ORAL | 1 refills | Status: DC
Start: 2016-07-20 — End: 2016-09-14

## 2016-07-20 NOTE — Assessment & Plan Note (Signed)
Long history of, uses Valtrex as needed with improvement. Continue current regimen.

## 2016-07-20 NOTE — Assessment & Plan Note (Signed)
Diagnosed several years ago and is currently managed on Zoloft 50 mg. GAD 7 score 12 today. Denies SI/HI. Given increased symptoms over the past 6 months, we will increase Zoloft to 100 mg once daily. Prescription for hydroxyzine provided to use as needed for anxiety outbursts. Follow-up in 6 weeks for further evaluation.

## 2016-07-20 NOTE — Assessment & Plan Note (Signed)
Long history of, related to menstrual cycle. Currently working with GYN for IUD placement in hopes of reduction in frequency of migraines. Uses Fioricet occasionally.

## 2016-07-20 NOTE — Progress Notes (Signed)
Pre visit review using our clinic review tool, if applicable. No additional management support is needed unless otherwise documented below in the visit note. 

## 2016-07-20 NOTE — Progress Notes (Signed)
Subjective:    Patient ID: Sheri Ruiz, female    DOB: 03-04-1986, 30 y.o.   MRN: [id number]  HPI  Sheri Ruiz is a 30 year old female who presents today to establish care and discuss the problems mentioned below. Will obtain old records.  1) Generalized Anxiety Disorder: Diagnosed several years ago and is currently managed on Zoloft 50 mg. She did try to come off of her medication in the past but realized that she felt her anxiety was better controlled with medication. Over the past 6 months she's noticed irritability, increased anxiety, decreased sleep. GAD 7 score of 12 today. Denies SI/HI. She experiences increased bursts of anxiety several times weekly and thinks she may be experiencing panic attacks.   2) Frequent Headaches/Migraines: Currently managed on Fioricet for which she takes occasionally for migraines. Migraines typically occur during her menstrual cycle. She is working with GYN and now has an IUD that she had inserted in June 2017. She will experience migraines once monthly on average, but has noticed a slight improvement. She will experience loss of peripheral vision, photophobia, and occasional nausea. Overall the Fioricet does well to manage symptoms.   3) Cold Sores: Long history of and is mangaed on Valtrex that she takes as needed with improvement. She has noticed more frequent breakouts over the past 6 months with her increase in anxiety.  Review of Systems  HENT:       History of herpes labialis  Eyes:       Does experience photophobia with migraines  Respiratory: Negative for shortness of breath.   Cardiovascular: Negative for chest pain.       Occasional palpitations with increased bursts of anxiety  Genitourinary:       IUD placed per GYN  Neurological:       Headaches overall stable.  Psychiatric/Behavioral: Positive for sleep disturbance. Negative for suicidal ideas. The patient is nervous/anxious.        Past Medical History:  Diagnosis Date  .  Abnormal Pap smear of cervix   . Depression   . LGSIL (low grade squamous intraepithelial dysplasia)   . Migraine   . Oral herpes   . Preterm delivery      Social History   Social History  . Marital status: Married    Spouse name: N/A  . Number of children: N/A  . Years of education: N/A   Occupational History  . Not on file.   Social History Main Topics  . Smoking status: Never Smoker  . Smokeless tobacco: Not on file  . Alcohol use No  . Drug use: No  . Sexual activity: Yes    Birth control/ protection: None   Other Topics Concern  . Not on file   Social History Narrative   Married.   2 children.   Works as a Social workerhair stylist.   Enjoys spending time with family.     Past Surgical History:  Procedure Laterality Date  . COLPOSCOPY    . TONSILLECTOMY      Family History  Problem Relation Age of Onset  . Transient ischemic attack Mother   . Hypertension Maternal Grandmother   . Clotting disorder Maternal Grandmother     Allergies  Allergen Reactions  . Amoxicillin Rash and Nausea And Vomiting    Current Outpatient Prescriptions on File Prior to Visit  Medication Sig Dispense Refill  . butalbital-acetaminophen-caffeine (FIORICET) 50-325-40 MG tablet Take 1-2 tablets by mouth every 6 (six) hours as needed for headache. (  Patient not taking: Reported on 07/20/2016) 20 tablet 0   No current facility-administered medications on file prior to visit.     BP 118/72   Pulse 75   Temp 97.6 F (36.4 C) (Oral)   Ht 5\' 3"  (1.6 m)   Wt 180 lb (81.6 kg)   SpO2 98%   BMI 31.89 kg/m    Objective:   Physical Exam  Constitutional: She appears well-nourished.  Eyes: EOM are normal. Pupils are equal, round, and reactive to light.  Neck: Neck supple.  Cardiovascular: Normal rate and regular rhythm.   Pulmonary/Chest: Effort normal and breath sounds normal.  Neurological: No cranial nerve deficit.  Skin: Skin is warm and dry.  Psychiatric: She has a normal mood and  affect.          Assessment & Plan:

## 2016-07-20 NOTE — Patient Instructions (Signed)
We've increased the dose of your Zoloft from 50 mg to 100 mg. If you have several tablets remaining of Zoloft 50 mg, you may take 2 tablets. I have sent a new prescription for 100 mg. Once you receive this, take 1 tablet by mouth once daily.  You may use the hydroxyzine as needed for breakthrough anxiety. Take 1 tablet by mouth up to twice daily as needed. Caution as this medication may cause drowsiness.   Follow up with me in 4-6 weeks for re-evaluation of anxiety.  It was a pleasure to meet you today! Please don't hesitate to call me with any questions. Welcome to [facility]!

## 2016-08-15 ENCOUNTER — Encounter: Payer: Self-pay | Admitting: Primary Care

## 2016-08-18 ENCOUNTER — Ambulatory Visit (INDEPENDENT_AMBULATORY_CARE_PROVIDER_SITE_OTHER): Payer: BLUE CROSS/BLUE SHIELD | Admitting: Primary Care

## 2016-08-18 ENCOUNTER — Encounter: Payer: Self-pay | Admitting: Primary Care

## 2016-08-18 DIAGNOSIS — F411 Generalized anxiety disorder: Secondary | ICD-10-CM | POA: Diagnosis not present

## 2016-08-18 NOTE — Assessment & Plan Note (Signed)
Improved on increased dose of Zoloft. Has not needed hydroxyzine. Continue Zoloft 100 mg tablets. Will continue to monitor.

## 2016-08-18 NOTE — Patient Instructions (Signed)
Continue Zoloft 100 mg tablets for anxiety.  You may take the hydroxyzine as needed for any breakthrough anxiety/panic attacks.  It was a pleasure to see you today!

## 2016-08-18 NOTE — Progress Notes (Signed)
Pre visit review using our clinic review tool, if applicable. No additional management support is needed unless otherwise documented below in the visit note. 

## 2016-08-18 NOTE — Progress Notes (Signed)
Subjective:    Patient ID: Sheri Ruiz, female    DOB: 1986-10-01, 30 y.o.   MRN: [id number]  HPI  [name] is a 30 year old female who presents today for follow up of Generalized Anxiety Disorder. She presented as a new patient on 08/16 with complaints of anxiety with a GAD 7 score of 12. She was currently managed on Zoloft 50 mg but had noticed increased symptoms of anxiety for the previous 6 months. After evaluation and assessment her Zoloft was increased to 100 mg and she was provided with a prescription for hydroxyzine to use PRN for panic attacks.  Since her last visit she's feeling much improved. She's noticed increased patience with her children, denies anger outbursts, denies panic attacks. She's not had to use the hydroxyzine as she's not experienced panic attacks. Denies SI/HI, GI upset. She has noticed intermittent dizziness but will occur once to twice weekly, this will dissipate quickly and does not cause disruption.  Review of Systems  Respiratory: Negative for shortness of breath.   Cardiovascular: Negative for chest pain.  Gastrointestinal: Negative for abdominal pain and nausea.  Neurological: Positive for dizziness.  Psychiatric/Behavioral: Negative for sleep disturbance and suicidal ideas. The patient is not nervous/anxious.        Past Medical History:  Diagnosis Date  . Abnormal Pap smear of cervix   . Depression   . LGSIL (low grade squamous intraepithelial dysplasia)   . Migraine   . Oral herpes   . Preterm delivery      Social History   Social History  . Marital status: Married    Spouse name: N/A  . Number of children: N/A  . Years of education: N/A   Occupational History  . Not on file.   Social History Main Topics  . Smoking status: Never Smoker  . Smokeless tobacco: Not on file  . Alcohol use No  . Drug use: No  . Sexual activity: Yes    Birth control/ protection: None   Other Topics Concern  . Not on file   Social History  Narrative   Married.   2 children.   Works as a Social workerhair stylist.   Enjoys spending time with family.     Past Surgical History:  Procedure Laterality Date  . COLPOSCOPY    . TONSILLECTOMY      Family History  Problem Relation Age of Onset  . Transient ischemic attack Mother   . Hypertension Maternal Grandmother   . Clotting disorder Maternal Grandmother     Allergies  Allergen Reactions  . Amoxicillin Rash and Nausea And Vomiting    Current Outpatient Prescriptions on File Prior to Visit  Medication Sig Dispense Refill  . hydrOXYzine (ATARAX/VISTARIL) 25 MG tablet Take 1 tablet (25 mg total) by mouth 2 (two) times daily as needed for anxiety. 30 tablet 0  . sertraline (ZOLOFT) 100 MG tablet Take 1 tablet (100 mg total) by mouth daily. 30 tablet 1  . valACYclovir (VALTREX) 500 MG tablet Take 500 mg by mouth 2 (two) times daily.    . butalbital-acetaminophen-caffeine (FIORICET) 50-325-40 MG tablet Take 1-2 tablets by mouth every 6 (six) hours as needed for headache. (Patient not taking: Reported on 08/18/2016) 20 tablet 0   No current facility-administered medications on file prior to visit.     BP 102/64   Pulse 71   Temp 98.3 F (36.8 C) (Oral)   Ht 5\' 3"  (1.6 m)   Wt 180 lb 1.9 oz (81.7  kg)   SpO2 96%   BMI 31.91 kg/m    Objective:   Physical Exam  Constitutional: She appears well-nourished.  Cardiovascular: Normal rate and regular rhythm.   Pulmonary/Chest: Effort normal and breath sounds normal.  Skin: Skin is warm and dry.  Psychiatric: She has a normal mood and affect.          Assessment & Plan:

## 2016-09-14 ENCOUNTER — Other Ambulatory Visit: Payer: Self-pay | Admitting: Primary Care

## 2016-09-14 DIAGNOSIS — F411 Generalized anxiety disorder: Secondary | ICD-10-CM

## 2016-11-18 ENCOUNTER — Other Ambulatory Visit: Payer: Self-pay | Admitting: Primary Care

## 2016-11-18 DIAGNOSIS — F411 Generalized anxiety disorder: Secondary | ICD-10-CM

## 2017-01-15 ENCOUNTER — Observation Stay
Admission: EM | Admit: 2017-01-15 | Discharge: 2017-01-15 | Disposition: A | Discharge status: Home / Self Care | Payer: BLUE CROSS/BLUE SHIELD | Attending: Internal Medicine | Admitting: Internal Medicine

## 2017-01-15 ENCOUNTER — Emergency Department: Payer: BLUE CROSS/BLUE SHIELD

## 2017-01-15 ENCOUNTER — Observation Stay (HOSPITAL_BASED_OUTPATIENT_CLINIC_OR_DEPARTMENT_OTHER)
Admit: 2017-01-15 | Discharge: 2017-01-15 | Disposition: A | Discharge status: Home / Self Care | Payer: BLUE CROSS/BLUE SHIELD | Attending: Cardiovascular Disease | Admitting: Cardiovascular Disease

## 2017-01-15 ENCOUNTER — Encounter: Payer: Self-pay | Admitting: Emergency Medicine

## 2017-01-15 DIAGNOSIS — Z880 Allergy status to penicillin: Secondary | ICD-10-CM | POA: Insufficient documentation

## 2017-01-15 DIAGNOSIS — R748 Abnormal levels of other serum enzymes: Secondary | ICD-10-CM | POA: Diagnosis not present

## 2017-01-15 DIAGNOSIS — I471 Supraventricular tachycardia, unspecified: Secondary | ICD-10-CM | POA: Diagnosis present

## 2017-01-15 DIAGNOSIS — F329 Major depressive disorder, single episode, unspecified: Secondary | ICD-10-CM | POA: Diagnosis not present

## 2017-01-15 DIAGNOSIS — R0789 Other chest pain: Secondary | ICD-10-CM | POA: Diagnosis not present

## 2017-01-15 DIAGNOSIS — R7989 Other specified abnormal findings of blood chemistry: Secondary | ICD-10-CM | POA: Diagnosis not present

## 2017-01-15 DIAGNOSIS — G43821 Menstrual migraine, not intractable, with status migrainosus: Secondary | ICD-10-CM

## 2017-01-15 DIAGNOSIS — B001 Herpesviral vesicular dermatitis: Secondary | ICD-10-CM | POA: Diagnosis not present

## 2017-01-15 DIAGNOSIS — Z79899 Other long term (current) drug therapy: Secondary | ICD-10-CM | POA: Diagnosis not present

## 2017-01-15 DIAGNOSIS — I361 Nonrheumatic tricuspid (valve) insufficiency: Secondary | ICD-10-CM | POA: Diagnosis not present

## 2017-01-15 DIAGNOSIS — R000 Tachycardia, unspecified: Secondary | ICD-10-CM | POA: Diagnosis present

## 2017-01-15 DIAGNOSIS — E876 Hypokalemia: Secondary | ICD-10-CM | POA: Insufficient documentation

## 2017-01-15 DIAGNOSIS — F411 Generalized anxiety disorder: Secondary | ICD-10-CM | POA: Insufficient documentation

## 2017-01-15 DIAGNOSIS — R778 Other specified abnormalities of plasma proteins: Secondary | ICD-10-CM

## 2017-01-15 LAB — COMPREHENSIVE METABOLIC PANEL
ALBUMIN: 3.4 g/dL — AB (ref 3.5–5.0)
ALT: 17 U/L (ref 14–54)
AST: 19 U/L (ref 15–41)
Alkaline Phosphatase: 58 U/L (ref 38–126)
Anion gap: 8 (ref 5–15)
BILIRUBIN TOTAL: 0.5 mg/dL (ref 0.3–1.2)
BUN: 20 mg/dL (ref 6–20)
CHLORIDE: 111 mmol/L (ref 101–111)
CO2: 23 mmol/L (ref 22–32)
CREATININE: 0.58 mg/dL (ref 0.44–1.00)
Calcium: 8 mg/dL — ABNORMAL LOW (ref 8.9–10.3)
GFR calc Af Amer: >60 mL/min (ref >60)
GFR calc non Af Amer: >60 mL/min (ref >60)
GLUCOSE: 114 mg/dL — AB (ref 65–99)
POTASSIUM: 3.3 mmol/L — AB (ref 3.5–5.1)
Sodium: 142 mmol/L (ref 135–145)
Total Protein: 6 g/dL — ABNORMAL LOW (ref 6.5–8.1)

## 2017-01-15 LAB — TSH
TSH: 3.307 u[IU]/mL (ref 0.350–4.500)
TSH: 3.22 u[IU]/mL (ref 0.350–4.500)

## 2017-01-15 LAB — TROPONIN I
Troponin I: 0.24 ng/mL (ref <0.03)
Troponin I: 0.68 ng/mL (ref <0.03)

## 2017-01-15 LAB — ECHOCARDIOGRAM COMPLETE
HEIGHTINCHES: 63 in
WEIGHTICAEL: 2896 [oz_av]

## 2017-01-15 LAB — CBC
HEMATOCRIT: 39.3 % (ref 35.0–47.0)
Hemoglobin: 13.3 g/dL (ref 12.0–16.0)
MCH: 29.1 pg (ref 26.0–34.0)
MCHC: 34 g/dL (ref 32.0–36.0)
MCV: 85.5 fL (ref 80.0–100.0)
Platelets: 277 10*3/uL (ref 150–440)
RBC: 4.59 MIL/uL (ref 3.80–5.20)
RDW: 14.6 % — AB (ref 11.5–14.5)
WBC: 10.8 10*3/uL (ref 3.6–11.0)

## 2017-01-15 LAB — MAGNESIUM: Magnesium: 1.6 mg/dL — ABNORMAL LOW (ref 1.7–2.4)

## 2017-01-15 MED ORDER — MAGNESIUM OXIDE 400 (241.3 MG) MG PO TABS: 400.0000 mg | ORAL_TABLET | Freq: Every day | ORAL | 0 refills | Status: DC

## 2017-01-15 MED ORDER — METOPROLOL SUCCINATE ER 25 MG PO TB24: 12.5000 mg | ORAL_TABLET | Freq: Every day | ORAL | Status: DC

## 2017-01-15 MED ORDER — ACETAMINOPHEN 325 MG PO TABS: 650.0000 mg | ORAL_TABLET | Freq: Four times a day (QID) | ORAL | Status: DC | PRN

## 2017-01-15 MED ORDER — SERTRALINE HCL 100 MG PO TABS
100.0000 mg | ORAL_TABLET | Freq: Every day | ORAL | Status: DC
  Filled 2017-01-15: qty 1

## 2017-01-15 MED ORDER — ACETAMINOPHEN 650 MG RE SUPP: 650.0000 mg | Freq: Four times a day (QID) | RECTAL | Status: DC | PRN

## 2017-01-15 MED ORDER — SODIUM CHLORIDE 0.9 % IV BOLUS (SEPSIS)
1000.0000 mL | Freq: Once | INTRAVENOUS | Status: AC
  Administered 2017-01-15: 1000 mL via INTRAVENOUS

## 2017-01-15 MED ORDER — POTASSIUM CHLORIDE CRYS ER 20 MEQ PO TBCR
40.0000 meq | EXTENDED_RELEASE_TABLET | Freq: Once | ORAL | Status: AC
  Administered 2017-01-15: 40 meq via ORAL
  Filled 2017-01-15: qty 2

## 2017-01-15 MED ORDER — POTASSIUM CHLORIDE ER 10 MEQ PO TBCR: 10.0000 meq | EXTENDED_RELEASE_TABLET | Freq: Every day | ORAL | 0 refills | Status: DC

## 2017-01-15 MED ORDER — SODIUM CHLORIDE 0.9% FLUSH
3.0000 mL | Freq: Two times a day (BID) | INTRAVENOUS | Status: DC
  Administered 2017-01-15: 3 mL via INTRAVENOUS

## 2017-01-15 MED ORDER — PROPRANOLOL HCL 20 MG PO TABS: 20.0000 mg | ORAL_TABLET | ORAL | 0 refills | Status: DC | PRN

## 2017-01-15 MED ORDER — ADENOSINE 6 MG/2ML IV SOLN
INTRAVENOUS | Status: AC
  Filled 2017-01-15: qty 2

## 2017-01-15 MED ORDER — DOCUSATE SODIUM 100 MG PO CAPS: 100.0000 mg | ORAL_CAPSULE | Freq: Two times a day (BID) | ORAL | Status: DC

## 2017-01-15 MED ORDER — ONDANSETRON HCL 4 MG/2ML IJ SOLN
4.0000 mg | Freq: Once | INTRAMUSCULAR | Status: AC
  Administered 2017-01-15: 4 mg via INTRAVENOUS

## 2017-01-15 MED ORDER — ONDANSETRON HCL 4 MG/2ML IJ SOLN
INTRAMUSCULAR | Status: AC
  Filled 2017-01-15: qty 2

## 2017-01-15 MED ORDER — ONDANSETRON HCL 4 MG PO TABS: 4.0000 mg | ORAL_TABLET | Freq: Four times a day (QID) | ORAL | Status: DC | PRN

## 2017-01-15 MED ORDER — HYDROXYZINE HCL 10 MG PO TABS: 25.0000 mg | ORAL_TABLET | Freq: Two times a day (BID) | ORAL | Status: DC | PRN

## 2017-01-15 MED ORDER — BUTALBITAL-APAP-CAFFEINE 50-325-40 MG PO TABS: 1.0000 | ORAL_TABLET | Freq: Four times a day (QID) | ORAL | Status: DC | PRN

## 2017-01-15 MED ORDER — ONDANSETRON HCL 4 MG/2ML IJ SOLN: 4.0000 mg | Freq: Four times a day (QID) | INTRAMUSCULAR | Status: DC | PRN

## 2017-01-15 MED ORDER — DILTIAZEM HCL 30 MG PO TABS: 30.0000 mg | ORAL_TABLET | ORAL | 0 refills | Status: DC | PRN

## 2017-01-15 MED ORDER — MAGNESIUM SULFATE 2 GM/50ML IV SOLN
2.0000 g | Freq: Once | INTRAVENOUS | Status: DC
  Filled 2017-01-15: qty 50

## 2017-01-15 MED ORDER — METOPROLOL SUCCINATE ER 25 MG PO TB24
12.5000 mg | ORAL_TABLET | Freq: Every day | ORAL | Status: DC
  Administered 2017-01-15: 12.5 mg via ORAL
  Filled 2017-01-15: qty 1

## 2017-01-15 MED ORDER — ADENOSINE 12 MG/4ML IV SOLN
INTRAVENOUS | Status: AC
  Filled 2017-01-15: qty 4

## 2017-01-15 MED ORDER — POTASSIUM CHLORIDE CRYS ER 20 MEQ PO TBCR
40.0000 meq | EXTENDED_RELEASE_TABLET | ORAL | Status: AC
  Administered 2017-01-15: 40 meq via ORAL
  Filled 2017-01-15: qty 2

## 2017-01-15 MED ORDER — ENOXAPARIN SODIUM 40 MG/0.4ML ~~LOC~~ SOLN: 40.0000 mg | SUBCUTANEOUS | Status: DC

## 2017-01-15 MED ORDER — POTASSIUM CHLORIDE IN NACL 20-0.9 MEQ/L-% IV SOLN
INTRAVENOUS | Status: DC
  Administered 2017-01-15: 09:00:00 via INTRAVENOUS
  Filled 2017-01-15 (×3): qty 1000

## 2017-01-15 MED ORDER — MAGNESIUM SULFATE 2 GM/50ML IV SOLN
2.0000 g | INTRAVENOUS | Status: AC
  Administered 2017-01-15: 2 g via INTRAVENOUS
  Filled 2017-01-15: qty 50

## 2017-01-15 MED ORDER — PERFLUTREN LIPID MICROSPHERE
1.0000 mL | INTRAVENOUS | Status: AC | PRN
  Administered 2017-01-15: 4 mL via INTRAVENOUS
  Filled 2017-01-15: qty 10

## 2017-01-15 NOTE — H&P (Signed)
Sheri Ruiz is an 31 y.o. female.   Chief Complaint: Racing heartbeat HPI: The patient with past medical history of depression presents to the emergency department complaining of a racing heartbeat. She states she experienced palpitations that awoke her from sleep. She felt nauseous and diaphoretic. She had a heaviness in her central chest that did not radiate and was present until her heart rate slowed. The patient admits to shortness of breath. En route to the emergency department the patient try to Valsalva per her mother's instructions (who has a history of SVT) and eventually was able to break that was captured his supraventricular tachycardia. She had 1 episode of nonbilious nonbloody emesis. Although the patient returned to normal sinus rhythm, laboratory evaluation revealed an elevated troponin which prompted the emergency department staff to call the hospitalist service for admission.  Past Medical History:  Diagnosis Date  . Abnormal Pap smear of cervix   . Depression   . LGSIL (low grade squamous intraepithelial dysplasia)   . Migraine   . Oral herpes   . Preterm delivery     Past Surgical History:  Procedure Laterality Date  . COLPOSCOPY    . TONSILLECTOMY      Family History  Problem Relation Age of Onset  . Transient ischemic attack Mother   . Hypertension Maternal Grandmother   . Clotting disorder Maternal Grandmother    Social History:  reports that she has never smoked. She has never used smokeless tobacco. She reports that she does not drink alcohol or use drugs.  Allergies:  Allergies  Allergen Reactions  . Amoxicillin Rash and Nausea And Vomiting    Prior to Admission medications   Medication Sig Start Date End Date Taking? Authorizing Provider  butalbital-acetaminophen-caffeine (FIORICET) 50-325-40 MG tablet Take 1-2 tablets by mouth every 6 (six) hours as needed for headache. Patient not taking: Reported on 08/18/2016 04/01/16 04/01/17  Lavonia Drafts, MD   hydrOXYzine (ATARAX/VISTARIL) 25 MG tablet Take 1 tablet (25 mg total) by mouth 2 (two) times daily as needed for anxiety. 07/20/16   [name], NP  sertraline (ZOLOFT) 100 MG tablet TAKE ONE TABLET BY MOUTH DAILY 11/18/16   [name], NP  valACYclovir (VALTREX) 500 MG tablet Take 500 mg by mouth 2 (two) times daily.    Historical Provider, MD     Results for orders placed or performed during the hospital encounter of 01/15/17 (from the past 48 hour(s))  CBC     Status: Abnormal   Collection Time: 01/15/17  4:06 AM  Result Value Ref Range   WBC 10.8 3.6 - 11.0 K/uL   RBC 4.59 3.80 - 5.20 MIL/uL   Hemoglobin 13.3 12.0 - 16.0 g/dL   HCT 39.3 35.0 - 47.0 %   MCV 85.5 80.0 - 100.0 fL   MCH 29.1 26.0 - 34.0 pg   MCHC 34.0 32.0 - 36.0 g/dL   RDW 14.6 (H) 11.5 - 14.5 %   Platelets 277 150 - 440 K/uL  Comprehensive metabolic panel     Status: Abnormal   Collection Time: 01/15/17  4:37 AM  Result Value Ref Range   Sodium 142 135 - 145 mmol/L   Potassium 3.3 (L) 3.5 - 5.1 mmol/L   Chloride 111 101 - 111 mmol/L   CO2 23 22 - 32 mmol/L   Glucose, Bld 114 (H) 65 - 99 mg/dL   BUN 20 6 - 20 mg/dL   Creatinine, Ser 0.58 0.44 - 1.00 mg/dL   Calcium 8.0 (  L) 8.9 - 10.3 mg/dL   Total Protein 6.0 (L) 6.5 - 8.1 g/dL   Albumin 3.4 (L) 3.5 - 5.0 g/dL   AST 19 15 - 41 U/L   ALT 17 14 - 54 U/L   Alkaline Phosphatase 58 38 - 126 U/L   Total Bilirubin 0.5 0.3 - 1.2 mg/dL   GFR calc non Af Amer >60 >60 mL/min   GFR calc Af Amer >60 >60 mL/min    Comment: (NOTE) The eGFR has been calculated using the CKD EPI equation. This calculation has not been validated in all clinical situations. eGFR's persistently <60 mL/min signify possible Chronic Kidney Disease.    Anion gap 8 5 - 15  Magnesium     Status: Abnormal   Collection Time: 01/15/17  4:37 AM  Result Value Ref Range   Magnesium 1.6 (L) 1.7 - 2.4 mg/dL  Troponin I     Status: Abnormal   Collection Time: 01/15/17  4:37 AM   Result Value Ref Range   Troponin I 0.24 (HH) <0.03 ng/mL    Comment: CRITICAL RESULT CALLED TO, READ BACK BY AND VERIFIED WITH LEA FERGUERSON AT 5361 01/15/17.PMH  TSH     Status: None   Collection Time: 01/15/17  4:37 AM  Result Value Ref Range   TSH 3.307 0.350 - 4.500 uIU/mL    Comment: Performed by a 3rd Generation assay with a functional sensitivity of <=0.01 uIU/mL.   Dg Chest 2 View  Result Date: 01/15/2017 CLINICAL DATA:  Chest heaviness and pressure starting around 11 p.m. EXAM: CHEST  2 VIEW COMPARISON:  None. FINDINGS: The heart size and mediastinal contours are within normal limits. Both lungs are clear. The visualized skeletal structures are unremarkable. IMPRESSION: No active cardiopulmonary disease. Electronically Signed   By: Lucienne Capers M.D.   On: 01/15/2017 04:43    Review of Systems  Constitutional: Negative for chills and fever.  HENT: Negative for sore throat and tinnitus.   Eyes: Negative for blurred vision and redness.  Respiratory: Positive for shortness of breath. Negative for cough.   Cardiovascular: Positive for chest pain (resolved) and palpitations. Negative for orthopnea and PND.  Gastrointestinal: Positive for nausea and vomiting. Negative for abdominal pain and diarrhea.  Genitourinary: Negative for dysuria, frequency and urgency.  Musculoskeletal: Negative for joint pain and myalgias.  Skin: Negative for rash.       No lesions  Neurological: Negative for speech change, focal weakness and weakness.  Endo/Heme/Allergies: Does not bruise/bleed easily.       No temperature intolerance  Psychiatric/Behavioral: Negative for depression and suicidal ideas.    Blood pressure 102/73, pulse 87, temperature 98.2 F (36.8 C), temperature source Oral, resp. rate 16, height _0  (1.6 m), weight 82.1 kg (181 lb), SpO2 100 %, unknown if currently breastfeeding. Physical Exam  Vitals reviewed. Constitutional: She is oriented to person, place, and time. She  appears well-developed and well-nourished. No distress.  HENT:  Head: Normocephalic and atraumatic.  Mouth/Throat: Oropharynx is clear and moist.  Eyes: Conjunctivae and EOM are normal. Pupils are equal, round, and reactive to light. No scleral icterus.  Neck: Normal range of motion. Neck supple. No JVD present. No tracheal deviation present. No thyromegaly present.  Cardiovascular: Normal rate, regular rhythm and normal heart sounds.  Exam reveals no gallop and no friction rub.   No murmur heard. Respiratory: Effort normal and breath sounds normal.  GI: Soft. Bowel sounds are normal. She exhibits no distension. There is no tenderness.  Genitourinary:  Genitourinary Comments: Deferred  Musculoskeletal: Normal range of motion. She exhibits no edema.  Lymphadenopathy:    She has no cervical adenopathy.  Neurological: She is alert and oriented to person, place, and time. No cranial nerve deficit. She exhibits normal muscle tone.  Skin: Skin is warm and dry. No rash noted. No erythema.  Psychiatric: She has a normal mood and affect. Her behavior is normal. Judgment and thought content normal.     Assessment/Plan This is a 31 year old female admitted for SVT and elevated troponin. 1. SVT: Resolved after Valsalva maneuver. Possible etiology is rapid weight loss and electrolyte losses as the patient is currently dieting. (She is not using supplements or stimulants but admits to a keto-diet). Continue to monitor telemetry. Replace electrolytes. 2. Elevated troponin: Secondary to persistent tachycardia. The patient has no more chest pain. Follow cardiac enzymes. Cardiology consult ordered. 3. DVT prophylaxis: Lovenox 4. GI prophylaxis: None The patient is a full code. Time spent on admission orders and patient care approximately 45 minutes  Harrie Foreman, MD 01/15/2017, 7:25 AM

## 2017-01-15 NOTE — ED Provider Notes (Signed)
[facility] Emergency Department Provider Note   ____________________________________________   First MD Initiated Contact with Patient 01/15/17 [phone]     (approximate)  I have reviewed the triage vital signs and the nursing notes.   HISTORY  Chief Complaint Tachycardia    HPI Sheri Ruiz is a 31 y.o. female who comes into the hospital today with some palpitations. The patient reports she was sitting on the couch when her heart started racing. She woke up her spouse at around 11:15. She was sweaty and had some chest pain. She took some Valium and fell asleep for some time. She woke back up his morning and reports her heart was still racing and she was still having pain. She decided to come into the hospital. She's had a couple cups of coffee earlier today but she's never had this happen in the past. The patient reports that she has some nausea and did vomit when she arrived in the emergency department. She had been doing well today has been eating and drinking without any difficulty. She felt as though someone was sitting on her chest. The patient's heart rate had improved by the time I evaluated her. She denies any pain at this time. Her mother has a history of SVT.   Past Medical History:  Diagnosis Date  . Abnormal Pap smear of cervix   . Depression   . LGSIL (low grade squamous intraepithelial dysplasia)   . Migraine   . Oral herpes   . Preterm delivery     Patient Active Problem List   Diagnosis Date Noted  . GAD (generalized anxiety disorder) 07/20/2016  . Migraines 07/20/2016  . Herpes labialis 07/20/2016    Past Surgical History:  Procedure Laterality Date  . COLPOSCOPY    . TONSILLECTOMY      Prior to Admission medications   Medication Sig Start Date End Date Taking? Authorizing Provider  butalbital-acetaminophen-caffeine (FIORICET) 50-325-40 MG tablet Take 1-2 tablets by mouth every 6 (six) hours as needed for headache. Patient  not taking: Reported on 08/18/2016 04/01/16 04/01/17  Jene Every, MD  hydrOXYzine (ATARAX/VISTARIL) 25 MG tablet Take 1 tablet (25 mg total) by mouth 2 (two) times daily as needed for anxiety. 07/20/16   Doreene Nest, NP  sertraline (ZOLOFT) 100 MG tablet TAKE ONE TABLET BY MOUTH DAILY 11/18/16   Doreene Nest, NP  valACYclovir (VALTREX) 500 MG tablet Take 500 mg by mouth 2 (two) times daily.    Historical Provider, MD    Allergies Amoxicillin  Family History  Problem Relation Age of Onset  . Transient ischemic attack Mother   . Hypertension Maternal Grandmother   . Clotting disorder Maternal Grandmother     Social History Social History  Substance Use Topics  . Smoking status: Never Smoker  . Smokeless tobacco: Never Used  . Alcohol use No    Review of Systems Constitutional: Sweats Eyes: No visual changes. ENT: No sore throat. Cardiovascular: Palpitations and chest pain Respiratory: Denies shortness of breath. Gastrointestinal: Nausea and vomiting with No abdominal pain. No diarrhea.  No constipation. Genitourinary: Negative for dysuria. Musculoskeletal: Negative for back pain. Skin: Negative for rash. Neurological: Negative for headaches, focal weakness or numbness.  10-point ROS otherwise negative.  ____________________________________________   PHYSICAL EXAM:  VITAL SIGNS: ED Triage Vitals  Enc Vitals Group     BP 01/15/17 0359 104/75     Pulse Rate 01/15/17 0359 (!) 113     Resp 01/15/17 0359 18  Temp 01/15/17 0359 98.2 F (36.8 C)     Temp Source 01/15/17 0359 Oral     SpO2 01/15/17 0359 100 %     Weight 01/15/17 0402 181 lb (82.1 kg)     Height 01/15/17 0402 5\' 3"  (1.6 m)     Head Circumference --      Peak Flow --      Pain Score 01/15/17 0402 0     Pain Loc --      Pain Edu? --      Excl. in GC? --     Constitutional: Alert and oriented. Well appearing and in Moderate distress. Eyes: Conjunctivae are normal. PERRL. EOMI. Head:  Atraumatic. Nose: No congestion/rhinnorhea. Mouth/Throat: Mucous membranes are moist.  Oropharynx non-erythematous. Cardiovascular: Normal rate, regular rhythm. Grossly normal heart sounds.  Good peripheral circulation. Respiratory: Normal respiratory effort.  No retractions. Lungs CTAB. Gastrointestinal: Soft and nontender. No distention. Positive bowel sounds Musculoskeletal: No lower extremity tenderness nor edema.   Neurologic:  Normal speech and language.  Skin:  Skin is warm, dry and intact.  Psychiatric: Mood and affect are normal.   ____________________________________________   LABS (all labs ordered are listed, but only abnormal results are displayed)  Labs Reviewed  CBC - Abnormal; Notable for the following:       Result Value   RDW 14.6 (*)    All other components within normal limits  COMPREHENSIVE METABOLIC PANEL - Abnormal; Notable for the following:    Potassium 3.3 (*)    Glucose, Bld 114 (*)    Calcium 8.0 (*)    Total Protein 6.0 (*)    Albumin 3.4 (*)    All other components within normal limits  MAGNESIUM - Abnormal; Notable for the following:    Magnesium 1.6 (*)    All other components within normal limits  TROPONIN I - Abnormal; Notable for the following:    Troponin I 0.24 (*)    All other components within normal limits  TSH   ____________________________________________  EKG  ED ECG REPORT I, Rebecka ApleyWebster,  Laveah Gloster P, the attending physician, personally viewed and interpreted this ECG.   Date: 01/15/2017  EKG Time: 351  Rate: 214  Rhythm: SVT  Axis: normal  Intervals:none  ST&T Change: none  ED ECG REPORT #2 I, Rebecka ApleyWebster,  Courtne Lighty P, the attending physician, personally viewed and interpreted this ECG.   Date: 01/15/2017  EKG Time: 406  Rate: 89  Rhythm: normal sinus rhythm  Axis: normal  Intervals:none  ST&T Change:  none   ____________________________________________  RADIOLOGY  CXR ____________________________________________   PROCEDURES  Procedure(s) performed: None  Procedures  Critical Care performed: Yes, see critical care note(s)  CRITICAL CARE Performed by: Lucrezia EuropeWebster,  Purnell Daigle P   Total critical care time: 30 minutes  Critical care time was exclusive of separately billable procedures and treating other patients.  Critical care was necessary to treat or prevent imminent or life-threatening deterioration.  Critical care was time spent personally by me on the following activities: development of treatment plan with patient and/or surrogate as well as nursing, discussions with consultants, evaluation of patient's response to treatment, examination of patient, obtaining history from patient or surrogate, ordering and performing treatments and interventions, ordering and review of laboratory studies, ordering and review of radiographic studies, pulse oximetry and re-evaluation of patient's condition.  ____________________________________________   INITIAL IMPRESSION / ASSESSMENT AND PLAN / ED COURSE  Pertinent labs & imaging results that were available during my care of the patient  were reviewed by me and considered in my medical decision making (see chart for details).  This is a 31 year old female who comes into the hospital today with some palpitations. The patient's initial EKG showed a heart rate of 214 and supraventricular tachycardia. We did draw some adenosine and place an IV but as a replacement IV the patient held her breath and bear down and the tachycardia broke. The patient's heart rate went into the 60s and the patient became very diaphoretic and nauseous and vomited. I did give the patient a dose of Zofran and ordered a liter of normal saline. I discussed with the patient that if everything was unremarkable that she would be discharged home. We check some blood work and it  appears that the patient's troponin is elevated at 0.24. The patient's magnesium was also 1.6. The patient did have one low blood pressure reading in the 80s over 60s but it did improve. I will give the patient a second liter of normal saline and I will admit her to the hospitalist service. I discussed this with the patient and she understands and agrees the plan as stated. The patient will be admitted to the hospital.  Clinical Course as of Jan 15 603  Sun Jan 15, 2017  0456 No active cardiopulmonary disease. DG Chest 2 View [AW]    Clinical Course User Index [AW] Rebecka Apley, MD     ____________________________________________   FINAL CLINICAL IMPRESSION(S) / ED DIAGNOSES  Final diagnoses:  SVT (supraventricular tachycardia) (HCC)  Elevated troponin  Hypomagnesemia      NEW MEDICATIONS STARTED DURING THIS VISIT:  New Prescriptions   No medications on file     Note:  This document was prepared using Dragon voice recognition software and may include unintentional dictation errors.    Rebecka Apley, MD 01/15/17 [phone]

## 2017-01-15 NOTE — Progress Notes (Signed)
*  PRELIMINARY RESULTS* Echocardiogram 2D Echocardiogram has been performed. Definity Contrast used on this study.  Sheri Ruiz 01/15/2017, 1:26 PM

## 2017-01-15 NOTE — Progress Notes (Signed)
Ambulated patient around the unit twice. Patient walked at a steady pace. HR did not exceed 100 bpm. Patient was not short of breath and denied chest pain.

## 2017-01-15 NOTE — ED Triage Notes (Signed)
Pt arrives to ED with c/o tachycardia. Pt states that she was sitting on her couch at home and all of the sudden her heart started to race. Pt states that her heart has been racing since around 2315 last night. Pt is pale and experiencing N/V at this time in triage.

## 2017-01-15 NOTE — Discharge Instructions (Signed)
Resume diet and activity as before ° ° °

## 2017-01-15 NOTE — Progress Notes (Signed)
Patient discharged per MD order and hospital protocol. Patient verbalized understanding of discharge instructions, medications and follow up instructions. Patient was given prescriptions for medications. Patient escorted off the unit via staff.

## 2017-01-15 NOTE — Consult Note (Signed)
Cardiology Consultation Note  Patient ID: Sheri Ruiz, MRN: [id number], DOB/AGE: 1986/09/09 31 y.o. Admit date: 01/15/2017   Date of Consult: 01/15/2017 Primary Physician: Morrie Sheldon, NP Primary Cardiologist: New to [facility]  Chief Complaint: Tachycardia, shortness of breath Reason for Consult: SVT Physician requesting consult: Dr. Sheryle Hail   HPI: 31 y.o. female with h/o depression per the notes, obesity, presenting with tachycardia starting 11 PM yesterday evening, presenting to the emergency room noted to have SVT  She reports that symptoms started 11 PM, she took Valium, able to go to sleep We'll At 3 AM with continued tachycardia She went to the emergency room, In the emergency room SVT documented on EKG rate 214 bpm One episode of emesis, nonbilious Valsalva in the emergency room, converted to normal sinus rhythm Follow-up EKG confirming normal sinus rhythm with no significant ST or T-wave changes  This was her first episode of SVT She does report very short rare episodes of fluttering in the past but nothing like this She is following a very strict keto diet for weight loss  Lab work reviewed on arrival, potassium low 3.3, magnesium 1.6 She has been having some episodes of loose bowel movements which she attributes to her new diet Troponin elevated 0.3, follow-up 0.6 Denies any significant chest pain only had chest fullness while she had tachycardia  Otherwise very active at baseline, good exercise tolerance  Past Medical History:  Diagnosis Date  . Abnormal Pap smear of cervix   . Depression   . LGSIL (low grade squamous intraepithelial dysplasia)   . Migraine   . Oral herpes   . Preterm delivery       Most Recent Cardiac Studies: Echocardiogram done today showing normal LV systolic function ejection fraction greater than 55%, mild to moderate MR, TR, mildly elevated right heart pressures    Surgical History:  Past Surgical History:  Procedure  Laterality Date  . COLPOSCOPY    . TONSILLECTOMY       Home Meds: Prior to Admission medications   Medication Sig Start Date End Date Taking? Authorizing Provider  butalbital-acetaminophen-caffeine (FIORICET) 50-325-40 MG tablet Take 1-2 tablets by mouth every 6 (six) hours as needed for headache. Patient not taking: Reported on 08/18/2016 04/01/16 04/01/17  Jene Every, MD  diltiazem (CARDIZEM) 30 MG tablet Take 1 tablet (30 mg total) by mouth as needed. 01/15/17   Milagros Loll, MD  hydrOXYzine (ATARAX/VISTARIL) 25 MG tablet Take 1 tablet (25 mg total) by mouth 2 (two) times daily as needed for anxiety. 07/20/16   Doreene Nest, NP  magnesium oxide (MAG-OX) 400 (241.3 Mg) MG tablet Take 1 tablet (400 mg total) by mouth daily. 01/15/17   Srikar Sudini, MD  potassium chloride (K-DUR) 10 MEQ tablet Take 1 tablet (10 mEq total) by mouth daily. 01/15/17   Milagros Loll, MD  propranolol (INDERAL) 20 MG tablet Take 1 tablet (20 mg total) by mouth as needed. 01/15/17   Srikar Sudini, MD  sertraline (ZOLOFT) 100 MG tablet TAKE ONE TABLET BY MOUTH DAILY 11/18/16   Doreene Nest, NP  valACYclovir (VALTREX) 500 MG tablet Take 500 mg by mouth 2 (two) times daily.    Historical Provider, MD    Inpatient Medications:  . docusate sodium  100 mg Oral BID  . enoxaparin (LOVENOX) injection  40 mg Subcutaneous Q24H  . metoprolol succinate  12.5 mg Oral Daily  . sertraline  100 mg Oral Daily  . sodium chloride flush  3 mL Intravenous Q12H  Allergies:  Allergies  Allergen Reactions  . Amoxicillin Rash and Nausea And Vomiting    Social History   Social History  . Marital status: Married    Spouse name: N/A  . Number of children: N/A  . Years of education: N/A   Occupational History  . Not on file.   Social History Main Topics  . Smoking status: Never Smoker  . Smokeless tobacco: Never Used  . Alcohol use No  . Drug use: No  . Sexual activity: Yes    Birth control/ protection: None    Other Topics Concern  . Not on file   Social History Narrative   Married.   2 children.   Works as a Social workerhair stylist.   Enjoys spending time with family.      Family History  Problem Relation Age of Onset  . Transient ischemic attack Mother   . Hypertension Maternal Grandmother   . Clotting disorder Maternal Grandmother     Review of Systems: Review of Systems  Constitutional: Negative.   Respiratory: Negative.   Cardiovascular: Positive for palpitations.       Tachycardia, now resolved  Gastrointestinal: Negative.   Musculoskeletal: Negative.   Neurological: Negative.   Psychiatric/Behavioral: Negative.   All other systems reviewed and are negative.    Labs: CBC  Recent Labs  01/15/17 0406  WBC 10.8  HGB 13.3  HCT 39.3  MCV 85.5  PLT 277   Basic Metabolic Panel  Recent Labs  01/15/17 0437  NA 142  K 3.3*  CL 111  CO2 23  GLUCOSE 114*  BUN 20  CREATININE 0.58  CALCIUM 8.0*  MG 1.6*   Liver Function Tests  Recent Labs  01/15/17 0437  AST 19  ALT 17  ALKPHOS 58  BILITOT 0.5  PROT 6.0*  ALBUMIN 3.4*   No results for input(s): LIPASE, AMYLASE in the last 72 hours. Cardiac Enzymes  Recent Labs  01/15/17 0437 01/15/17 0851  TROPONINI 0.24* 0.68*   BNP Invalid input(s): POCBNP D-Dimer No results for input(s): DDIMER in the last 72 hours. Hemoglobin A1C No results for input(s): HGBA1C in the last 72 hours. Fasting Lipid Panel No results for input(s): CHOL, HDL, LDLCALC, TRIG, CHOLHDL, LDLDIRECT in the last 72 hours. Thyroid Function Tests  Recent Labs  01/15/17 0437  TSH 3.220  3.307    Radiology/Studies:  Dg Chest 2 View  Result Date: 01/15/2017 CLINICAL DATA:  Chest heaviness and pressure starting around 11 p.m. EXAM: CHEST  2 VIEW COMPARISON:  None. FINDINGS: The heart size and mediastinal contours are within normal limits. Both lungs are clear. The visualized skeletal structures are unremarkable. IMPRESSION: No active  cardiopulmonary disease. Electronically Signed   By: Burman NievesWilliam  Stevens M.D.   On: 01/15/2017 04:43    EKG: EKG on arrival showing supraventricular tachycardia ventricular rate 214 bpm, nonspecific ST abnormality  Follow-up EKG after SVT broke, showing oral sinus rhythm with no significant ST or T-wave changes  EKG lab work, chest x-ray, echocardiogram reviewed independently by myself  Weights: Filed Weights   01/15/17 0402  Weight: 181 lb (82.1 kg)     Physical Exam:Telemetry showing normal sinus rhythm Blood pressure 105/62, pulse 79, temperature 97.9 F (36.6 C), temperature source Oral, resp. rate 20, height 5\' 3"  (1.6 m), weight 181 lb (82.1 kg), SpO2 100 %, unknown if currently breastfeeding. Body mass index is 32.06 kg/m. GEN: Well nourished, well developed, in no acute distress.  HEENT: Grossly normal.  Neck: Supple, no JVD,  carotid bruits, or masses. Cardiac: RRR, no murmurs, rubs, or gallops. No clubbing, cyanosis, edema.  Radials/DP/PT 2+ and equal bilaterally.  Respiratory:  Respirations regular and unlabored, clear to auscultation bilaterally. GI: Soft, nontender, nondistended, BS + x 4. MS: no deformity or atrophy. Skin: warm and dry, no rash. Neuro:  Strength and sensation are intact. Psych: AAOx3.  Normal affect.    Assessment and Plan:   1. SVT Several hours of SVT before terminating with Valsalva Documented on EKG, rate 214 bpm Low potassium and magnesium, she is following a strict diet for weight loss Long discussion with her concerning mechanism of arrhythmia, Discussed various treatment options including medications daily basis, pill in the pocket approach We did discuss ablation She does have a family history of SVT, mother who required ablation At this time she does not want ablation, first to try pill in the pocket approach We discussed contacting EMTs of stations for EKG and adenosine if she has recurrent arrhythmia Prescription provided for  propranolol, diltiazem to be taken as needed for breakthrough arrhythmia We discussed carotid sinus massage, Valsalva maneuver  2) valvular disease Mild to moderate MR and TR, moderately elevated right heart pressures Likely secondary to several liter boluses she received in the emergency room, also started on fluid infusion overnight at 125 cc/h. We have offered Lasix, she reports that she feels fine with no significant shortness of breath Should she develop shortness of breath symptoms, would give Lasix Otherwise normal ejection fraction greater than 55%  3) elevated troponin Peak 0.68 Secondary to tachycardia, SVT rate 214 beats for minute Normal ejection fraction with no wall motion abnormality on echocardiogram Given no significant risk factors, likely etiology of her elevated troponin from elevated heart rate, No further testing has been ordered   Long discussion concerning above, family at the bedside, all questions answered  Total encounter time more than 80 minutes  Greater than 50% was spent in counseling and coordination of care with the patient   Signed, Dossie Arbour, MD, Ph.D [facility] 01/15/2017

## 2017-01-15 NOTE — ED Notes (Signed)
Pt assisted to wheelchair upon arrival; c/o chest heaviness and pressure; says pain started around 11pm; took a valium thinking it was anxiety; slept for a few hours and woke at 3 am with pressure to center of chest; history of SVT

## 2017-01-16 LAB — HEMOGLOBIN A1C
Hgb A1c MFr Bld: 5.1 % (ref 4.8–5.6)
Mean Plasma Glucose: 100 mg/dL

## 2017-01-16 NOTE — Discharge Summary (Signed)
[facility] -  at [facility]   PATIENT NAME: Sheri Ruiz    MR#:  [id number]  DATE OF BIRTH:  Aug 18, 1986  DATE OF ADMISSION:  01/15/2017 ADMITTING PHYSICIAN: Arnaldo Natal, MD  DATE OF DISCHARGE: 01/15/2017  4:28 PM  PRIMARY CARE PHYSICIAN: Morrie Sheldon, NP   ADMISSION DIAGNOSIS:  Hypomagnesemia [E83.42] SVT (supraventricular tachycardia) (HCC) [I47.1] Elevated troponin [R74.8]  DISCHARGE DIAGNOSIS:  Active Problems:   SVT (supraventricular tachycardia) (HCC)   Elevated troponin   Hypomagnesemia   Hypokalemia   SECONDARY DIAGNOSIS:   Past Medical History:  Diagnosis Date  . Abnormal Pap smear of cervix   . Depression   . LGSIL (low grade squamous intraepithelial dysplasia)   . Migraine   . Oral herpes   . Preterm delivery      ADMITTING HISTORY  Chief Complaint: Racing heartbeat HPI: The patient with past medical history of depression presents to the emergency department complaining of a racing heartbeat. She states she experienced palpitations that awoke her from sleep. She felt nauseous and diaphoretic. She had a heaviness in her central chest that did not radiate and was present until her heart rate slowed. The patient admits to shortness of breath. En route to the emergency department the patient try to Valsalva per her mother's instructions (who has a history of SVT) and eventually was able to break that was captured his supraventricular tachycardia. She had 1 episode of nonbilious nonbloody emesis. Although the patient returned to normal sinus rhythm, laboratory evaluation revealed an elevated troponin which prompted the emergency department staff to call the hospitalist service for admission.  HOSPITAL COURSE:   * SVT TSH was normal. Telemetry in the hospital showed no further episodes. Echocardiogram showed normal ejection fraction without any significant valvular abnormalities. She did have elevated troponin due to SVT.  This is not MI. Seen by cardiology Dr. Mariah Milling. He discussed with patient regarding Valsalva maneuver with this happens again. She was given prescriptions for propranolol and Cardizem to be used as needed.  * Hypokalemia and hypomagnesemia replace through IV and oral. Prescriptions given at discharge.  Patient discharged in stable condition with electrolyte replacement prescriptions and propranolol and Cardizem prescriptions.  CONSULTS OBTAINED:  Treatment Team:  Antonieta Iba, MD  DRUG ALLERGIES:   Allergies  Allergen Reactions  . Amoxicillin Rash and Nausea And Vomiting    DISCHARGE MEDICATIONS:   Discharge Medication List as of 01/15/2017  3:46 PM    START taking these medications   Details  diltiazem (CARDIZEM) 30 MG tablet Take 1 tablet (30 mg total) by mouth as needed., Starting Sun 01/15/2017, Print    magnesium oxide (MAG-OX) 400 (241.3 Mg) MG tablet Take 1 tablet (400 mg total) by mouth daily., Starting Sun 01/15/2017, Print    potassium chloride (K-DUR) 10 MEQ tablet Take 1 tablet (10 mEq total) by mouth daily., Starting Sun 01/15/2017, Print    propranolol (INDERAL) 20 MG tablet Take 1 tablet (20 mg total) by mouth as needed., Starting Sun 01/15/2017, Print      CONTINUE these medications which have NOT CHANGED   Details  butalbital-acetaminophen-caffeine (FIORICET) 50-325-40 MG tablet Take 1-2 tablets by mouth every 6 (six) hours as needed for headache., Starting 04/01/2016, Until Sat 04/01/17, Print    hydrOXYzine (ATARAX/VISTARIL) 25 MG tablet Take 1 tablet (25 mg total) by mouth 2 (two) times daily as needed for anxiety., Starting Wed 07/20/2016, Normal    sertraline (ZOLOFT) 100 MG tablet TAKE ONE TABLET BY MOUTH DAILY,  Normal    valACYclovir (VALTREX) 500 MG tablet Take 500 mg by mouth 2 (two) times daily., Historical Med        Today   VITAL SIGNS:  Blood pressure 105/62, pulse 79, temperature 97.9 F (36.6 C), temperature source Oral, resp. rate 20,  height 5\' 3"  (1.6 m), weight 82.1 kg (181 lb), SpO2 100 %, unknown if currently breastfeeding.  I/O:  No intake or output data in the 24 hours ending 01/16/17 1458  PHYSICAL EXAMINATION:  Physical Exam  GENERAL:  31 y.o.-year-old patient lying in the bed with no acute distress.  LUNGS: Normal breath sounds bilaterally, no wheezing, rales,rhonchi or crepitation. No use of accessory muscles of respiration.  CARDIOVASCULAR: S1, S2 normal. No murmurs, rubs, or gallops.  ABDOMEN: Soft, non-tender, non-distended. Bowel sounds present. No organomegaly or mass.  NEUROLOGIC: Moves all 4 extremities. PSYCHIATRIC: The patient is alert and oriented x 3.  SKIN: No obvious rash, lesion, or ulcer.   DATA REVIEW:   CBC  Recent Labs Lab 01/15/17 0406  WBC 10.8  HGB 13.3  HCT 39.3  PLT 277    Chemistries   Recent Labs Lab 01/15/17 0437  NA 142  K 3.3*  CL 111  CO2 23  GLUCOSE 114*  BUN 20  CREATININE 0.58  CALCIUM 8.0*  MG 1.6*  AST 19  ALT 17  ALKPHOS 58  BILITOT 0.5    Cardiac Enzymes  Recent Labs Lab 01/15/17 0851  TROPONINI 0.68*    Microbiology Results  Results for orders placed or performed during the hospital encounter of 05/05/15  OB RESULT CONSOLE Group B Strep     Status: None   Collection Time: 04/29/15 12:00 AM  Result Value Ref Range Status   GBS Negative  Final  OB RESULT CONSOLE Group B Strep     Status: None   Collection Time: 04/29/15 12:00 AM  Result Value Ref Range Status   GBS Negative  Final  OB RESULT CONSOLE Group B Strep     Status: None   Collection Time: 04/29/15 12:00 AM  Result Value Ref Range Status   GBS Negative  Final    RADIOLOGY:  Dg Chest 2 View  Result Date: 01/15/2017 CLINICAL DATA:  Chest heaviness and pressure starting around 11 p.m. EXAM: CHEST  2 VIEW COMPARISON:  None. FINDINGS: The heart size and mediastinal contours are within normal limits. Both lungs are clear. The visualized skeletal structures are unremarkable.  IMPRESSION: No active cardiopulmonary disease. Electronically Signed   By: Burman Nieves M.D.   On: 01/15/2017 04:43    Follow up with PCP in 1 week.  Management plans discussed with the patient, family and they are in agreement.  CODE STATUS:  Code Status History    Date Active Date Inactive Code Status Order ID Comments User Context   01/15/2017  7:55 AM 01/15/2017  7:34 PM Full Code [id number]  Arnaldo Natal, MD Inpatient   05/05/2015  9:26 PM 05/07/2015  6:22 PM Full Code [id number]  Elenora Fender Ward, MD Inpatient   05/05/2015 10:59 AM 05/05/2015  9:26 PM Full Code [id number]  Jannet Mantis, CNM Inpatient   05/05/2015 10:31 AM 05/05/2015 10:59 AM Full Code [id number]  Jannet Mantis, CNM Inpatient      TOTAL TIME TAKING CARE OF THIS PATIENT ON DAY OF DISCHARGE: more than 30 minutes.   Milagros Loll R M.D on 01/16/2017 at 2:58 PM  Between 7am to 6pm - Pager - [phone]  After 6pm go  to [email or web address] - password [email or web address]  [facility]  Office  [phone]  CC: Primary care physician; Morrie Sheldonlark,Katherine Kendal, NP  Note: This dictation was prepared with Dragon dictation along with smaller phrase technology. Any transcriptional errors that result from this process are unintentional.

## 2017-01-17 ENCOUNTER — Telehealth: Payer: Self-pay

## 2017-01-17 NOTE — Telephone Encounter (Signed)
-----   Message from Coralee RudSabrina F Gilley sent at 01/17/2017  3:00 PM EST ----- Regarding: tcm/ph 2/26 2 pm Dr. Mariah MillingGollan

## 2017-01-17 NOTE — Telephone Encounter (Signed)
Patient contacted regarding discharge from [facility] on 01/17/17.  Patient understands to follow up with Dr. Mariah MillingGollan on 01/30/17 at 2:00 at [facility]. Patient understands discharge instructions? yes Patient understands medications and regiment? yes Patient understands to bring all medications to this visit? yes

## 2017-01-18 ENCOUNTER — Ambulatory Visit: Payer: BLUE CROSS/BLUE SHIELD | Admitting: Internal Medicine

## 2017-01-30 ENCOUNTER — Ambulatory Visit (INDEPENDENT_AMBULATORY_CARE_PROVIDER_SITE_OTHER): Payer: BLUE CROSS/BLUE SHIELD | Admitting: Cardiovascular Disease

## 2017-01-30 ENCOUNTER — Encounter: Payer: Self-pay | Admitting: Cardiovascular Disease

## 2017-01-30 VITALS — BP 100/60 | HR 90 | Ht 63.0 in | Wt 182.0 lb

## 2017-01-30 DIAGNOSIS — R748 Abnormal levels of other serum enzymes: Secondary | ICD-10-CM

## 2017-01-30 DIAGNOSIS — R778 Other specified abnormalities of plasma proteins: Secondary | ICD-10-CM

## 2017-01-30 DIAGNOSIS — I471 Supraventricular tachycardia: Secondary | ICD-10-CM | POA: Diagnosis not present

## 2017-01-30 DIAGNOSIS — E876 Hypokalemia: Secondary | ICD-10-CM | POA: Diagnosis not present

## 2017-01-30 DIAGNOSIS — R7989 Other specified abnormal findings of blood chemistry: Secondary | ICD-10-CM

## 2017-01-30 NOTE — Progress Notes (Signed)
Cardiology Office Note  Date:  01/30/2017   ID:  Sheri Ruiz, DOB Jul 20, 1986, MRN [id number]  PCP:  Morrie Sheldon, NP   Chief Complaint  Patient presents with  . other    Follow up from [facility]; SVT & discuss Echo. Meds reviewed by the pt. verbally. Pt. c/o fluttering in chest.     HPI:  31 y.o. female with h/o depression per the notes, obesity, presentingTo the hospital with SVT 01/15/2017 starting 11 PM requiring Valsalva before termination, troponin up to 0.6, low magnesium, low potassium in the setting of being on keto diet for weight loss Grandmother and mother with SVT, ablation She presents to establish care in the [place] office for her SVT after recent hospital discharge   Since her discharge she reports that she is doing well She has been taking magnesium and potassium supplement daily Occasionally has palpitations but denies any tachycardia concerning for SVT Reviewed previous hospital records, previous presentation  symptoms started 11 PM, she took Valium, able to go to sleep Woke up At 3 AM with continued tachycardia She went to the emergency room, In the emergency room SVT documented on EKG rate 214 bpm One episode of emesis, nonbilious Valsalva in the emergency room, converted to normal sinus rhythm Follow-up EKG confirming normal sinus rhythm with no significant ST or T-wave changes This was her first episode of SVT  potassium low 3.3, magnesium 1.6 She did report episodes of loose bowel movements which she attributes to her new diet Troponin elevated 0.3, follow-up 0.6 Denies any significant chest pain only had chest fullness while she had tachycardia  At baseline she is very active at baseline, good exercise tolerance  Echocardiogram 01/15/17 Results reviewed with her today Left ventricle: The cavity size was normal. Systolic function was normal. The estimated ejection fraction was in the range of 60% to 65%. Wall motion was normal; there were  no regional wall motion abnormalities. Left ventricular diastolic function parameters were normal. - Mitral valve: There was mild to moderate regurgitation. - Left atrium: The atrium was normal in size. - Right ventricle: Systolic function was normal. - Tricuspid valve: There was mild-moderate regurgitation. - Pulmonary arteries: Systolic pressure was within the normal   range.  EKG on today's visit shows normal sinus rhythm with rate 96 bpm no significant ST or T-wave changes   PMH:   has a past medical history of Abnormal Pap smear of cervix; Depression; LGSIL (low grade squamous intraepithelial dysplasia); Migraine; Oral herpes; and Preterm delivery.  PSH:    Past Surgical History:  Procedure Laterality Date  . COLPOSCOPY    . TONSILLECTOMY      Current Outpatient Prescriptions  Medication Sig Dispense Refill  . butalbital-acetaminophen-caffeine (FIORICET) 50-325-40 MG tablet Take 1-2 tablets by mouth every 6 (six) hours as needed for headache. 20 tablet 0  . diltiazem (CARDIZEM) 30 MG tablet Take 1 tablet (30 mg total) by mouth as needed. 20 tablet 0  . hydrOXYzine (ATARAX/VISTARIL) 25 MG tablet Take 1 tablet (25 mg total) by mouth 2 (two) times daily as needed for anxiety. 30 tablet 0  . magnesium oxide (MAG-OX) 400 (241.3 Mg) MG tablet Take 1 tablet (400 mg total) by mouth daily. 30 tablet 0  . potassium chloride (K-DUR) 10 MEQ tablet Take 1 tablet (10 mEq total) by mouth daily. 30 tablet 0  . propranolol (INDERAL) 20 MG tablet Take 1 tablet (20 mg total) by mouth as needed. 20 tablet 0  . sertraline (ZOLOFT) 100 MG  tablet TAKE ONE TABLET BY MOUTH DAILY 30 tablet 2  . valACYclovir (VALTREX) 500 MG tablet Take 500 mg by mouth 2 (two) times daily.     No current facility-administered medications for this visit.      Allergies:   Amoxicillin   Social History:  The patient  reports that she has never smoked. She has never used smokeless tobacco. She reports that she does not  drink alcohol or use drugs.   Family History:   family history includes Clotting disorder in her maternal grandmother; Hypertension in her maternal grandmother; Transient ischemic attack in her mother.    Review of Systems: Review of Systems  Constitutional: Negative.   Respiratory: Negative.   Cardiovascular: Negative.   Gastrointestinal: Negative.   Musculoskeletal: Negative.   Neurological: Negative.   Psychiatric/Behavioral: Negative.   All other systems reviewed and are negative.    PHYSICAL EXAM: VS:  BP 100/60 (BP Location: Left Arm, Patient Position: Sitting, Cuff Size: Normal)   Pulse 90   Ht 5\' 3"  (1.6 m)   Wt 182 lb (82.6 kg)   BMI 32.24 kg/m  , BMI Body mass index is 32.24 kg/m. GEN: Well nourished, well developed, in no acute distress  HEENT: normal  Neck: no JVD, carotid bruits, or masses Cardiac: RRR; no murmurs, rubs, or gallops,no edema  Respiratory:  clear to auscultation bilaterally, normal work of breathing GI: soft, nontender, nondistended, + BS MS: no deformity or atrophy  Skin: warm and dry, no rash Neuro:  Strength and sensation are intact Psych: euthymic mood, full affect    Recent Labs: 01/15/2017: ALT 17; BUN 20; Creatinine, Ser 0.58; Hemoglobin 13.3; Magnesium 1.6; Platelets 277; Potassium 3.3; Sodium 142; TSH 3.307; TSH 3.220    Lipid Panel No results found for: CHOL, HDL, LDLCALC, TRIG    Wt Readings from Last 3 Encounters:  01/30/17 182 lb (82.6 kg)  01/15/17 181 lb (82.1 kg)  08/18/16 180 lb 1.9 oz (81.7 kg)       ASSESSMENT AND PLAN:  SVT (supraventricular tachycardia) (HCC) - Plan: EKG 12-Lead Recent episode SVT lasting several hours broke eventually with Valsalva Kept in the hospital for further evaluation, normal echocardiogram Long discussion concerning various options Including ablation, pill in the pocket approach including use of adenosine with local fire department. She's not interested in ablation at this time as  this was her first episode. She does have diltiazem, propranolol at she can take as needed. Limited options given her bradycardia low blood pressure  EKG showing SVT provided to her   Elevated troponin Elevated troponin likely demand ischemia in the setting of heart rate more than 200 bpm . Low risk of ischemia . No further workup needed  Hypokalemia Low potassium may have helped trigger her arrhythmia, she is following strict keto diet  She will take potassium over-the-counter pill every other day   Hypomagnesemia Magnesium was low likely secondary to her diet She will take low-dose magnesium supplement every other day Recommended periodic basic metabolic panel check with primary care   Total encounter time more than 45 minutes  Greater than 50% was spent in counseling and coordination of care with the patient   Disposition:   F/U as needed    Orders Placed This Encounter  Procedures  . EKG 12-Lead     Signed, Dossie Arbourim Makinzi Prieur, M.D., Ph.D. 01/30/2017  [facility], [place] [phone]

## 2017-01-30 NOTE — Patient Instructions (Addendum)
You had SVT,  You might need adenosine IV bolus as needed for recurrent SVT episodes    Medication Instructions:   No medication changes made  Labwork:  No new labs needed  Testing/Procedures:  No further testing at this time   I recommend watching educational videos on topics of interest to you at:       [email or web address]  Enter code: HEARTCARE    Follow-Up: It was a pleasure seeing you in the office today. Please call us if you have new issues that need to be addressed before your next appt.  [phone]  Your physician wants you to follow-up in: as needed  If you need a refill on your cardiac medications before your next appointment, please call your pharmacy.             Supraventricular Tachycardia, Adult Supraventricular tachycardia (SVT) is a kind of abnormal heartbeat. It makes your heart beat very fast and then beat at a normal speed. A normal heart beats 60-100 times a minute. This condition can make your heart beat more than 150 times a minute. Times of having a fast heartbeat (episodes) can be scary, but they are usually not dangerous. They can lead to problems if:  They happen often.  They last a long time. Symptoms of this condition include:  A pounding heart.  A feeling that your heart is skipping beats (palpitations).  Weakness.  Trouble getting enough air (shortness of breath).  Pain or tightness in your chest.  Feeling like you are going to pass out (light-headedness).  Feeling worried or nervous (anxiety).  Dizziness.  Sweating.  Feeling sick to your stomach (nausea).  Passing out (fainting).  Tiredness. Sometimes, there are no symptoms. Follow these instructions at home: Stress  Avoid things that make you feel stressed.  Find out what helps you feel less stressed. Try:  Doing a relaxing activity, like yoga, meditation, or being out in nature.  Listening to relaxing music.  Doing relaxation techniques, like deep  breathing.  Taking steps to be healthy. These include getting lots of sleep, exercising, and eating a balanced diet.  Talking with a mental health doctor. Sleep  Try to get at least 7 hours of sleep each night. Tobacco and nicotine  Do not use anything that has nicotine or tobacco, such as cigarettes and e-cigarettes. If you need help quitting, ask your doctor. Alcohol  If alcohol gives you a fast heartbeat, do not drink alcohol.  If alcohol does not seem to give you a fast heartbeat, limit your alcohol. For nonpregnant women, this means no more than 1 drink a day. For men, this means no more than 2 drinks a day. "One drink" means one of these:  12 oz of beer.  5 oz of wine.  1 oz of hard liquor. Caffeine  If caffeine gives you a fast heartbeat, do not eat, drink, or use anything with caffeine in it.  If caffeine does not seem to give you a fast heartbeat, limit how much caffeine you eat, drink, or use. Stimulant drugs  Do not use stimulant drugs. These are drugs like cocaine or methamphetamine. If you need help quitting, ask your doctor. General instructions  Stay at a healthy weight.  Exercise regularly. Ask your doctor to suggest some good activities for you. Try one of these options:  150 minutes a week of gentle exercise, like walking or yoga.  75 minutes a week of exercise that is very active, like running or swimming.  A  combination of gentle exercise and very active exercise.  Do home treatments to slow down your heartbeat as told by your doctor.  Take over-the-counter and prescription medicines only as told by your doctor. Contact a doctor if:  You have a fast heartbeat more often.  Times of having a fast heartbeat last longer than before.  Your home treatments to slow down your heartbeat do not help.  You have new symptoms. Get help right away if:  You have chest pain.  Your symptoms get worse.  You have trouble breathing.  Your heart beats  very fast for more than 20 minutes.  You pass out (faint). These symptoms may be an emergency. Do not wait to see if the symptoms will go away. Get medical help right away. Call your local emergency services (911 in the [place] Do not drive yourself to the hospital.  This information is not intended to replace advice given to you by your health care provider. Make sure you discuss any questions you have with your health care provider. Document Released: 11/21/2005 Document Revised: 07/28/2016 Document Reviewed: 07/28/2016 Elsevier Interactive Patient Education  2017 Elsevier Inc. Adenosine injection What is this medicine? ADENOSINE (a DEN uh seen) is used to bring your heart back into a normal rhythm. This medicine is not useful for all types of irregular heart beats. It may be used to test the heart for coronary artery disease. This medicine may be used for other purposes; ask your health care provider or pharmacist if you have questions. COMMON BRAND NAME(S): Adenocard, Adenoscan What should I tell my health care provider before I take this medicine? They need to know if you have any of these conditions: -asthma -heart problems other than supraventricular tachycardia -an unusual or allergic reaction to adenosine, other medicines, foods, dyes, or preservatives -pregnant or trying to get pregnant -breast-feeding How should I use this medicine? This medicine is for injection into a vein. It is given by a health-care professional in a hospital or clinic setting. Talk to your pediatrician regarding the use of this medicine in children. Special care may be needed. Overdosage: If you think you have taken too much of this medicine contact a poison control center or emergency room at once. NOTE: This medicine is only for you. Do not share this medicine with others. What if I miss a dose? This does not apply. What may interact with this  medicine? -caffeine -carbamazepine -dipyridamole -guarana -theophylline This list may not describe all possible interactions. Give your health care provider a list of all the medicines, herbs, non-prescription drugs, or dietary supplements you use. Also tell them if you smoke, drink alcohol, or use illegal drugs. Some items may interact with your medicine. What should I watch for while using this medicine? Your condition will be monitored carefully while you are receiving this medicine. You may get drowsy or dizzy. Do not drive, use machinery, or do anything that needs mental alertness until you know how this medicine affects you. Do not stand or sit up quickly, especially if you are an older patient. This reduces the risk of dizzy or fainting spells. Alcohol may increase dizziness and drowsiness. Avoid alcoholic drinks. What side effects may I notice from receiving this medicine? Side effects that you should report to your doctor or health care professional as soon as possible: -allergic reactions like skin rash, itching or hives, swelling of the face, lips, or tongue -chest pain, tightness or palpitations -difficulty breathing -severe headache Side effects that usually do  not require medical attention (report to your doctor or health care professional if they continue or are bothersome): -flushing -headache -nausea, vomiting -tingling in arms, numbness This list may not describe all possible side effects. Call your doctor for medical advice about side effects. You may report side effects to FDA at 1-800-FDA-1088. Where should I keep my medicine? This drug is given in a hospital or clinic and will not be stored at home. NOTE: This sheet is a summary. It may not cover all possible information. If you have questions about this medicine, talk to your doctor, pharmacist, or health care provider.  2017 Elsevier/Gold Standard (2008-02-13 10:35:51)

## 2017-01-31 ENCOUNTER — Telehealth: Payer: Self-pay | Admitting: Primary Care

## 2017-01-31 DIAGNOSIS — E876 Hypokalemia: Secondary | ICD-10-CM

## 2017-01-31 NOTE — Telephone Encounter (Signed)
Please notify patient that I reviewed her visit with the cardiologist and we will need to monitor her electrolytes. Please set her up for repeat labs in 1 month to check on her electrolytes. Have her contact us sooner if she starts to notice palpitations.

## 2017-02-01 NOTE — Telephone Encounter (Signed)
Message left for patient to return my call.  

## 2017-02-03 NOTE — Telephone Encounter (Signed)
Tried to call patient and unable to leave message due to voicemail box is full

## 2017-02-07 NOTE — Telephone Encounter (Signed)
Tried to call patient and unable to leave message due to voicemail box is full 

## 2017-02-14 NOTE — Telephone Encounter (Signed)
Message left for patient to return my call.  

## 2017-02-16 NOTE — Telephone Encounter (Addendum)
Schedule office visit on 09/07/2017

## 2017-02-19 ENCOUNTER — Other Ambulatory Visit: Payer: Self-pay | Admitting: Primary Care

## 2017-02-19 DIAGNOSIS — F411 Generalized anxiety disorder: Secondary | ICD-10-CM

## 2017-02-20 NOTE — Telephone Encounter (Signed)
Ok to refill? Electronically refill request for sertraline (ZOLOFT) 100 MG tablet. Last prescribed on 11/18/2016. Last seen on 01/30/2017

## 2017-02-23 ENCOUNTER — Other Ambulatory Visit (INDEPENDENT_AMBULATORY_CARE_PROVIDER_SITE_OTHER): Payer: BLUE CROSS/BLUE SHIELD

## 2017-02-23 ENCOUNTER — Encounter (INDEPENDENT_AMBULATORY_CARE_PROVIDER_SITE_OTHER): Payer: Self-pay

## 2017-02-23 DIAGNOSIS — E876 Hypokalemia: Secondary | ICD-10-CM

## 2017-02-23 LAB — BASIC METABOLIC PANEL
BUN: 19 mg/dL (ref 6–23)
CALCIUM: 9.1 mg/dL (ref 8.4–10.5)
CO2: 25 mEq/L (ref 19–32)
CREATININE: 0.59 mg/dL (ref 0.40–1.20)
Chloride: 107 mEq/L (ref 96–112)
GFR: 125.07 mL/min (ref >60.00)
Glucose, Bld: 87 mg/dL (ref 70–99)
Potassium: 4.1 mEq/L (ref 3.5–5.1)
SODIUM: 138 meq/L (ref 135–145)

## 2017-02-23 LAB — MAGNESIUM: Magnesium: 2 mg/dL (ref 1.5–2.5)

## 2017-02-24 ENCOUNTER — Encounter: Payer: Self-pay | Admitting: *Deleted

## 2017-03-02 ENCOUNTER — Other Ambulatory Visit: Payer: BLUE CROSS/BLUE SHIELD

## 2017-03-25 DIAGNOSIS — J0100 Acute maxillary sinusitis, unspecified: Secondary | ICD-10-CM | POA: Diagnosis not present

## 2017-08-24 ENCOUNTER — Other Ambulatory Visit: Payer: Self-pay | Admitting: Primary Care

## 2017-08-24 DIAGNOSIS — F411 Generalized anxiety disorder: Secondary | ICD-10-CM

## 2017-09-06 ENCOUNTER — Other Ambulatory Visit: Payer: Self-pay

## 2017-09-06 NOTE — Telephone Encounter (Signed)
Needs office visit, please schedule at her convenience.

## 2017-09-06 NOTE — Telephone Encounter (Signed)
Spoken and notified patient of Kate's comments. Patient verbalized understanding.  Office visit on 09/07/2017

## 2017-09-06 NOTE — Telephone Encounter (Signed)
Pt left v/m requesting refill valtrex to [facility] [place]. Last seen 08/18/16. Mayra Reel NP has not filled before; would pt need to be seen? pt request cb.

## 2017-09-07 ENCOUNTER — Ambulatory Visit (INDEPENDENT_AMBULATORY_CARE_PROVIDER_SITE_OTHER): Payer: BLUE CROSS/BLUE SHIELD | Admitting: Primary Care

## 2017-09-07 ENCOUNTER — Encounter: Payer: Self-pay | Admitting: Primary Care

## 2017-09-07 VITALS — BP 112/68 | HR 77 | Temp 98.0°F | Ht 63.0 in | Wt 201.1 lb

## 2017-09-07 DIAGNOSIS — I471 Supraventricular tachycardia: Secondary | ICD-10-CM | POA: Diagnosis not present

## 2017-09-07 DIAGNOSIS — F411 Generalized anxiety disorder: Secondary | ICD-10-CM | POA: Diagnosis not present

## 2017-09-07 DIAGNOSIS — B001 Herpesviral vesicular dermatitis: Secondary | ICD-10-CM

## 2017-09-07 MED ORDER — SERTRALINE HCL 100 MG PO TABS: 50.0000 mg | ORAL_TABLET | Freq: Every day | ORAL | 0 refills | Status: DC

## 2017-09-07 MED ORDER — VALACYCLOVIR HCL 500 MG PO TABS: ORAL_TABLET | ORAL | 0 refills | Status: DC

## 2017-09-07 NOTE — Assessment & Plan Note (Signed)
Recurrent every 1-2 months per patient, discussed that this was very frequent. Will have her monitor closely and report more than 3-4 outbreaks annually. Consider daily use of Valtrex for 6-8 weeks if needed. Refills provided.

## 2017-09-07 NOTE — Progress Notes (Signed)
Subjective:    Patient ID: Sheri Ruiz, female    DOB: [birth year], 31 y.o.   MRN: [id number]  HPI  Sheri Ruiz is a 31 year old female who presents today for follow up.  1) GAD: Currently managed on sertraline 100 mg and hydroxyzine 25 mg. She's not used the hydroxyzine recently. She feels well managed on sertraline and plans on weaning down to 50 mg as she would like to try to eventually wean off. Denies anxiety, depression, SI/HI.  2) SVT: Family history of. Diagnosed in early 2018. Currently managed on diltiazem 30 mg and propranolol 20 mg that she uses as needed for SVT. She is supposed to take one or both of these medications as needed, starting with diltiazem.  She denies chest pain, palpitations. She's not had to use her medications since early 2018.  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 Experiences outbreaks monthly on average, but is not quite sure. She denies genital herpes.   Review of Systems  Constitutional: Negative for fever.  HENT:       Herpes labialis.   Respiratory: Negative for shortness of breath.   Cardiovascular: Negative for chest pain and palpitations.  Psychiatric/Behavioral:       Denies concerns for anxiety or depression. Would like to wean off of Zoloft.        Past Medical History:  Diagnosis Date  . Abnormal Pap smear of cervix   . Depression   . LGSIL (low grade squamous intraepithelial dysplasia)   . Migraine   . Oral herpes   . Preterm delivery      Social History   Social History  . Marital status: Married    Spouse name: N/A  . Number of children: N/A  . Years of education: N/A   Occupational History  . Not on file.   Social History Main Topics  . Smoking status: Never Smoker  . Smokeless tobacco: Never Used  . Alcohol use No  . Drug use: No  . Sexual activity: Yes    Birth control/ protection: None   Other Topics Concern  . Not on file   Social History Narrative   Married.   2  children.   Works as a Social worker.   Enjoys spending time with family.     Past Surgical History:  Procedure Laterality Date  . COLPOSCOPY    . TONSILLECTOMY      Family History  Problem Relation Age of Onset  . Transient ischemic attack Mother   . Hypertension Maternal Grandmother   . Clotting disorder Maternal Grandmother     Allergies  Allergen Reactions  . Amoxicillin Rash and Nausea And Vomiting    Current Outpatient Prescriptions on File Prior to Visit  Medication Sig Dispense Refill  . diltiazem (CARDIZEM) 30 MG tablet Take 1 tablet (30 mg total) by mouth as needed. 20 tablet 0  . magnesium oxide (MAG-OX) 400 (241.3 Mg) MG tablet Take 1 tablet (400 mg total) by mouth daily. 30 tablet 0  . sertraline (ZOLOFT) 100 MG tablet TAKE ONE TABLET BY MOUTH DAILY 90 tablet 0  . hydrOXYzine (ATARAX/VISTARIL) 25 MG tablet Take 1 tablet (25 mg total) by mouth 2 (two) times daily as needed for anxiety. (Patient not taking: Reported on 09/07/2017) 30 tablet 0  . potassium chloride (K-DUR) 10 MEQ tablet Take 1 tablet (10 mEq total) by mouth daily. (Patient not taking: Reported on 09/07/2017) 30 tablet 0  . propranolol (INDERAL) 20 MG  tablet Take 1 tablet (20 mg total) by mouth as needed. (Patient not taking: Reported on 09/07/2017) 20 tablet 0   No current facility-administered medications on file prior to visit.     BP 112/68   Pulse 77   Temp 98 F (36.7 C) (Oral)   Ht  (1.6 m)   Wt 201 lb 1.9 oz (91.2 kg)   SpO2 98%   BMI 35.63 kg/m    Objective:   Physical Exam  Constitutional: She appears well-nourished.  HENT:  Evidence of small dried lesion to mid lower lip.   Neck: Neck supple.  Cardiovascular: Normal rate and regular rhythm.   Pulmonary/Chest: Effort normal and breath sounds normal.  Skin: Skin is warm and dry.  Psychiatric: She has a normal mood and affect.          Assessment & Plan:

## 2017-09-07 NOTE — Assessment & Plan Note (Signed)
Doing well on Zoloft 100 mg. Will trial a reduced dose of 50 mg daily for at least one month. She will update at that time.

## 2017-09-07 NOTE — Patient Instructions (Signed)
Please keep track of your outbreaks and notify me if you get more than 3 outbreaks annually.  For an outbreak take 4 tablets by mouth twice daily for 1 day.  It was a pleasure to see you today!

## 2017-09-07 NOTE — Assessment & Plan Note (Signed)
No episodes since early 2018, no use of diltiazem or propranolol. Continue to monitor.

## 2017-11-17 ENCOUNTER — Encounter: Payer: Self-pay | Admitting: Internal Medicine

## 2017-11-17 ENCOUNTER — Ambulatory Visit: Payer: BLUE CROSS/BLUE SHIELD | Admitting: Internal Medicine

## 2017-11-17 VITALS — BP 100/60 | HR 89 | Temp 98.6°F | Wt 203.0 lb

## 2017-11-17 DIAGNOSIS — J069 Acute upper respiratory infection, unspecified: Secondary | ICD-10-CM | POA: Diagnosis not present

## 2017-11-17 NOTE — Assessment & Plan Note (Signed)
Discussed apparent viral etiology No signs yet of secondary bacterial infection  Discussed symptom relief If worsens next week, would send Rx for z-pak (PCN intolerance)

## 2017-11-17 NOTE — Patient Instructions (Signed)
Let me know next week if you are worsening 

## 2017-11-17 NOTE — Progress Notes (Signed)
Subjective:    Patient ID: Buren KosKristyn S Regula, female    DOB: [birth year], 31 y.o.   MRN: [id number]  HPI Here due to respiratory symptoms  Started with sore throat 3 nights ago Worried because she tends to get sinus infections Lots of head congestion Cough--mostly dry Some headache and pressure Some right ear pain No fever, chills or sweats No SOB  Taking tylenol cold and flu & dayquil--no clear help  Current Outpatient Medications on File Prior to Visit  Medication Sig Dispense Refill  . diltiazem (CARDIZEM) 30 MG tablet Take 1 tablet (30 mg total) by mouth as needed. 20 tablet 0  . hydrOXYzine (ATARAX/VISTARIL) 25 MG tablet Take 1 tablet (25 mg total) by mouth 2 (two) times daily as needed for anxiety. 30 tablet 0  . magnesium oxide (MAG-OX) 400 (241.3 Mg) MG tablet Take 1 tablet (400 mg total) by mouth daily. 30 tablet 0  . potassium chloride (K-DUR) 10 MEQ tablet Take 1 tablet (10 mEq total) by mouth daily. 30 tablet 0  . propranolol (INDERAL) 20 MG tablet Take 1 tablet (20 mg total) by mouth as needed. 20 tablet 0  . sertraline (ZOLOFT) 100 MG tablet Take 0.5 tablets (50 mg total) by mouth daily. 90 tablet 0  . valACYclovir (VALTREX) 500 MG tablet Take 4 tablets by mouth twice daily for one day as needed for outbreaks. 30 tablet 0   No current facility-administered medications on file prior to visit.     Allergies  Allergen Reactions  . Amoxicillin Rash and Nausea And Vomiting    Past Medical History:  Diagnosis Date  . Abnormal Pap smear of cervix   . Depression   . Hypokalemia   . Hypomagnesemia   . LGSIL (low grade squamous intraepithelial dysplasia)   . Migraine   . Oral herpes   . Preterm delivery     Past Surgical History:  Procedure Laterality Date  . COLPOSCOPY    . TONSILLECTOMY      Family History  Problem Relation Age of Onset  . Transient ischemic attack Mother   . Hypertension Maternal Grandmother   . Clotting disorder Maternal Grandmother      Social History   Socioeconomic History  . Marital status: Married    Spouse name: Not on file  . Number of children: Not on file  . Years of education: Not on file  . Highest education level: Not on file  Social Needs  . Financial resource strain: Not on file  . Food insecurity - worry: Not on file  . Food insecurity - inability: Not on file  . Transportation needs - medical: Not on file  . Transportation needs - non-medical: Not on file  Occupational History  . Not on file  Tobacco Use  . Smoking status: Never Smoker  . Smokeless tobacco: Never Used  Substance and Sexual Activity  . Alcohol use: No    Alcohol/week: 0.0 oz  . Drug use: No  . Sexual activity: Yes    Birth control/protection: None  Other Topics Concern  . Not on file  Social History Narrative   Married.   2 children.   Works as a Social workerhair stylist.   Enjoys spending time with family.    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 2 small children--- they also have colds No vomiting or diarrhea Appetite is off    Objective:   Physical Exam  Constitutional: She appears well-developed. No distress.  HENT:  No sinus  tenderness TMs normal Mild nasal inflammation/swelling Slight pharyngeal injection  Neck: No thyromegaly present.  Pulmonary/Chest: Effort normal. No respiratory distress. She has no wheezes. She has no rales.  Lymphadenopathy:    She has no cervical adenopathy.          Assessment & Plan:

## 2017-11-21 ENCOUNTER — Other Ambulatory Visit: Payer: Self-pay | Admitting: Primary Care

## 2017-11-21 DIAGNOSIS — F411 Generalized anxiety disorder: Secondary | ICD-10-CM

## 2017-11-21 NOTE — Telephone Encounter (Signed)
Ok to refill? Electronically refill request for sertraline (ZOLOFT) 100 MG tablet  Last prescribed on 08/24/2017. Last seen on 09/07/2017.

## 2017-12-25 IMAGING — CR DG CHEST 2V
2 series · 2 of 2 positions shown · non-contrast
Comparison: None.

CLINICAL DATA: Chest heaviness and pressure starting around 11
p.m..

EXAM:
CHEST  2 VIEW

[chest lat]
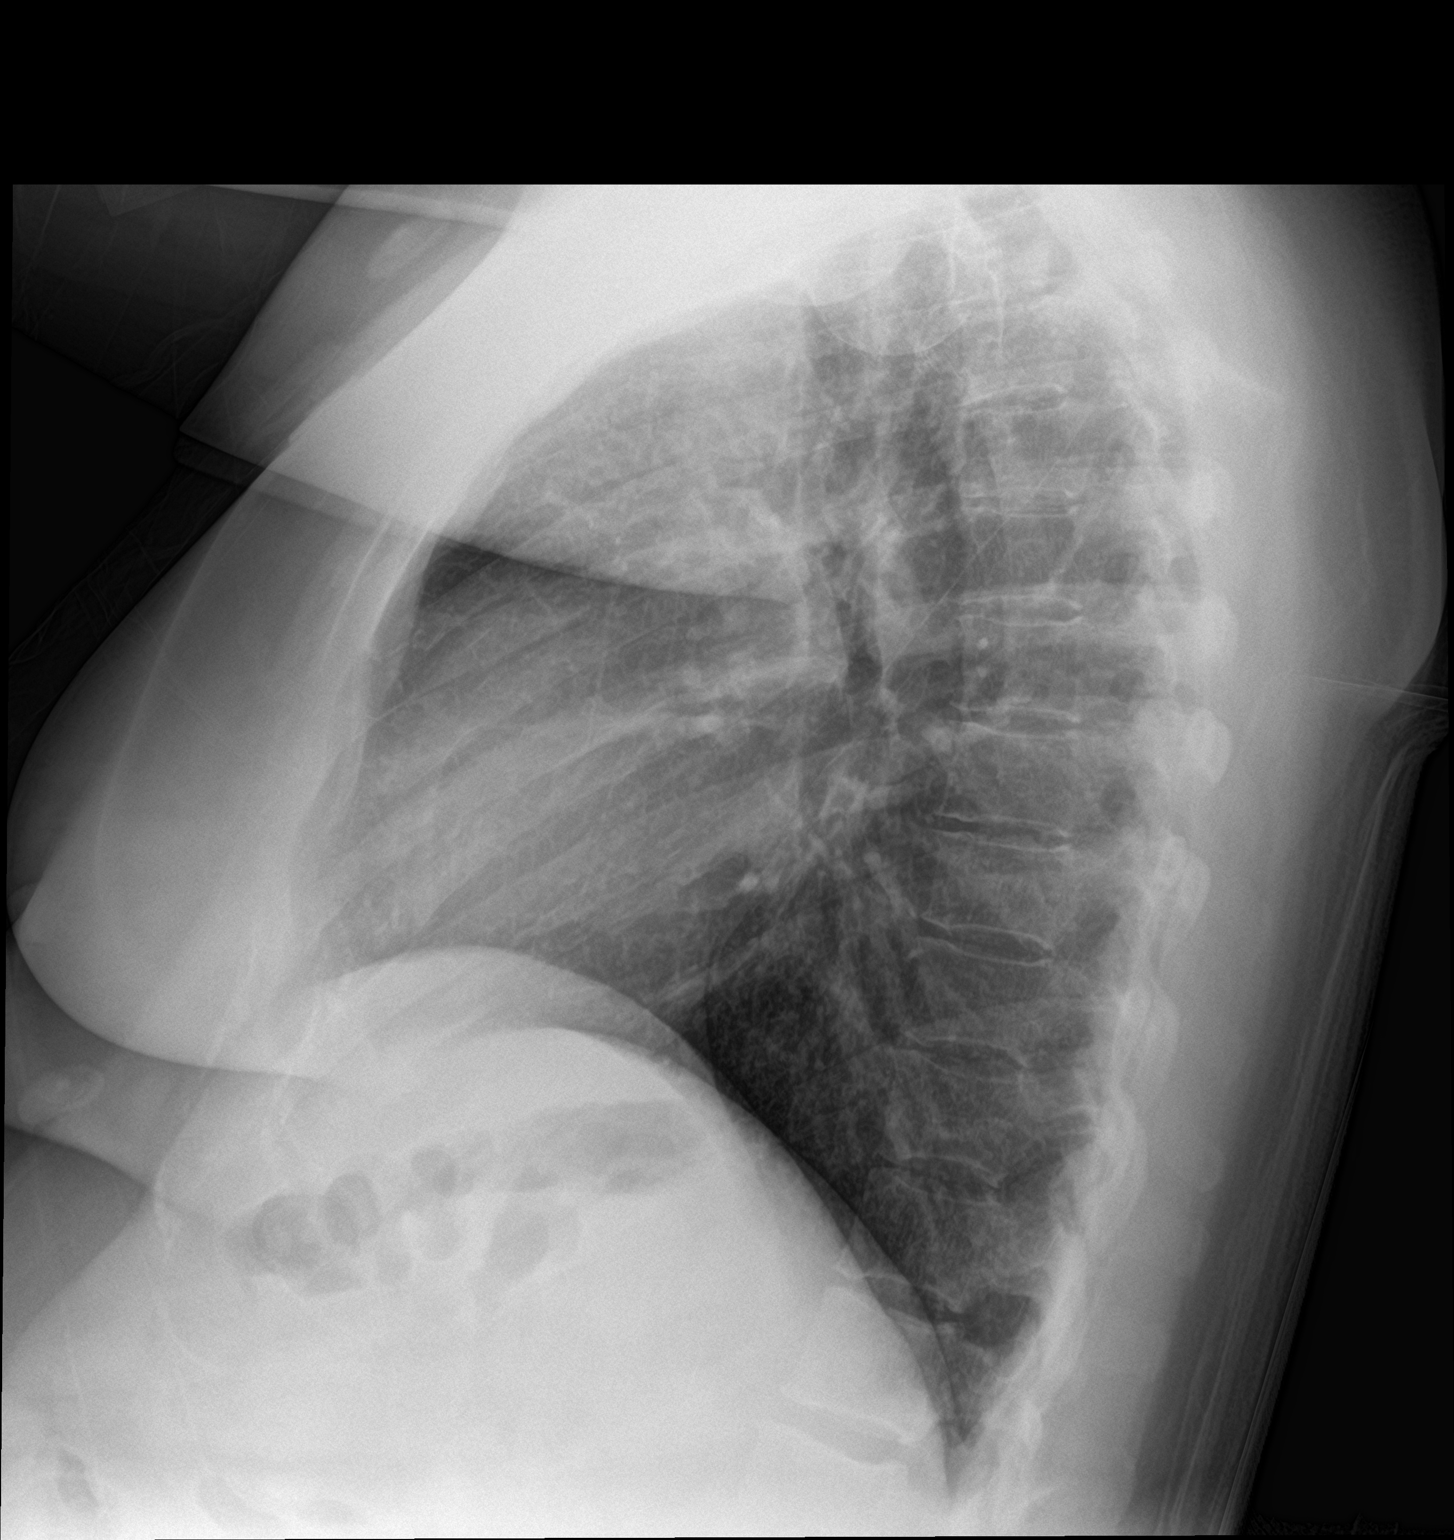

[chest ap]
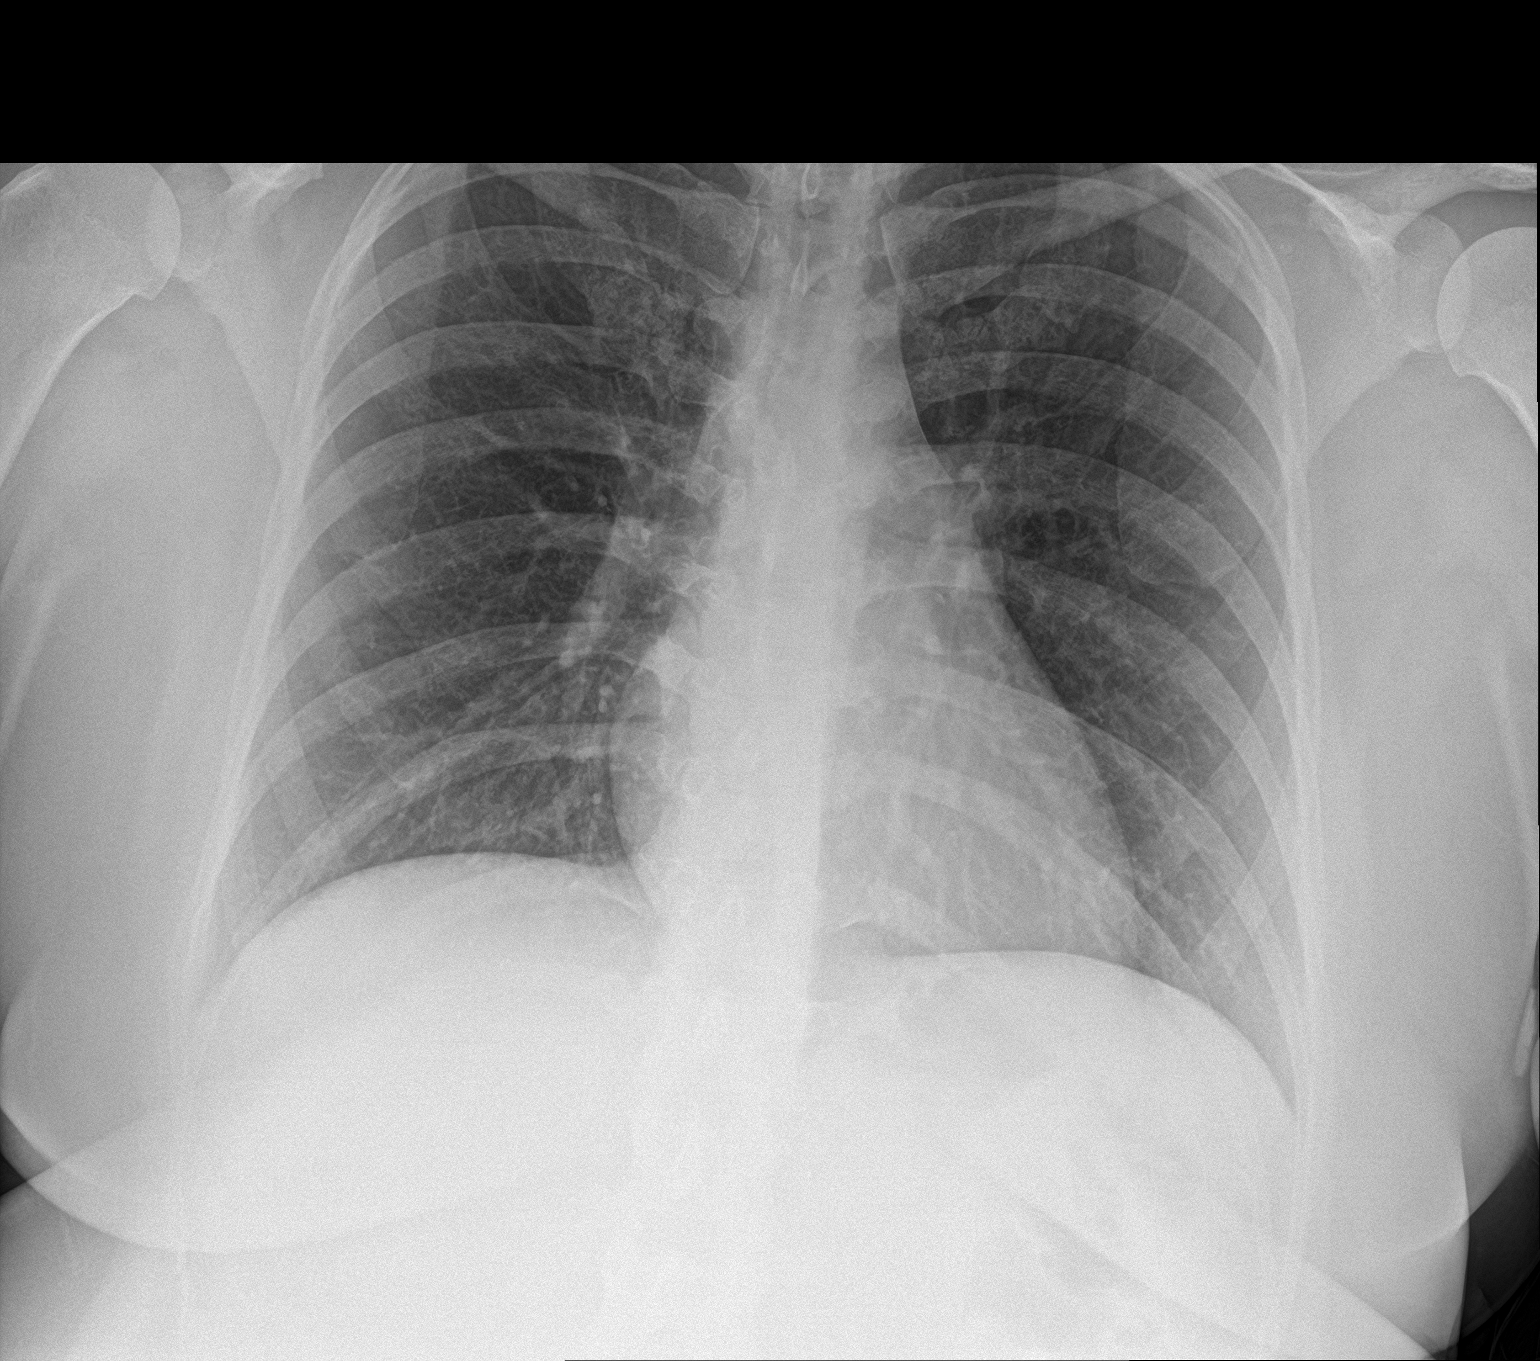

[2 of 2 positions shown; findings below may reference images not displayed]

FINDINGS: The heart size and mediastinal contours are within normal limits.
Both lungs are clear. The visualized skeletal structures are
unremarkable.
IMPRESSION: No active cardiopulmonary disease.

## 2018-01-07 DIAGNOSIS — J029 Acute pharyngitis, unspecified: Secondary | ICD-10-CM | POA: Diagnosis not present

## 2018-01-07 DIAGNOSIS — J309 Allergic rhinitis, unspecified: Secondary | ICD-10-CM | POA: Diagnosis not present

## 2018-01-18 DIAGNOSIS — Z01419 Encounter for gynecological examination (general) (routine) without abnormal findings: Secondary | ICD-10-CM | POA: Diagnosis not present

## 2018-01-22 ENCOUNTER — Encounter: Payer: Self-pay | Admitting: Primary Care

## 2018-01-23 DIAGNOSIS — J019 Acute sinusitis, unspecified: Secondary | ICD-10-CM | POA: Diagnosis not present

## 2018-02-04 DIAGNOSIS — J209 Acute bronchitis, unspecified: Secondary | ICD-10-CM | POA: Diagnosis not present

## 2018-03-02 ENCOUNTER — Other Ambulatory Visit: Payer: Self-pay | Admitting: Internal Medicine

## 2018-03-02 DIAGNOSIS — F411 Generalized anxiety disorder: Secondary | ICD-10-CM

## 2018-03-02 NOTE — Telephone Encounter (Signed)
Left message on vm per DPR. Created CRM

## 2018-03-02 NOTE — Telephone Encounter (Signed)
Based off of my last note she was planning on weaning down to 50 mg, has she done this or is she still taking 100 mg?

## 2018-03-06 NOTE — Telephone Encounter (Signed)
Unable to leave message since voicemail box is full

## 2018-03-07 ENCOUNTER — Encounter: Payer: Self-pay | Admitting: Primary Care

## 2018-03-07 DIAGNOSIS — F411 Generalized anxiety disorder: Secondary | ICD-10-CM

## 2018-03-07 MED ORDER — SERTRALINE HCL 100 MG PO TABS: 100.0000 mg | ORAL_TABLET | Freq: Every day | ORAL | 1 refills | Status: DC

## 2018-03-09 NOTE — Telephone Encounter (Signed)
Patient responded through MyChart

## 2018-03-15 ENCOUNTER — Encounter: Payer: Self-pay | Admitting: Primary Care

## 2018-03-16 NOTE — Telephone Encounter (Signed)
Will you find out what's going on with her Zoloft?

## 2018-03-19 NOTE — Telephone Encounter (Addendum)
Called [facility] and spoken to pharmacist. There was missing information on their side, it did not show supervising provider information. I have gave them the information.   I have called patient and notified her of this information.   Mayra ReelKate Clark is also aware.

## 2018-03-19 NOTE — Telephone Encounter (Signed)
Copied from CRM [id number]. Topic: Quick Communication - Rx Refill/Question >> Mar 19, 2018  9:20 AM Landry MellowFoltz, Melissa J wrote: Medication: sertraline (ZOLOFT) 100 MG tablet Has the patient contacted their pharmacy? Yes.   (Agent: If no, request that the patient contact the pharmacy for the refill.) Preferred Pharm[facility] (with phone number [facility] [place]  Agent: Please be advised that RX refills may take up to 3 business days. We ask that you follow-up with your pharmacy.  Pt says that the pharm is unable to fill rx - possible needs to be signed off by md?

## 2018-04-25 ENCOUNTER — Encounter: Payer: Self-pay | Admitting: Primary Care

## 2018-04-25 DIAGNOSIS — B001 Herpesviral vesicular dermatitis: Secondary | ICD-10-CM

## 2018-04-26 MED ORDER — VALACYCLOVIR HCL 500 MG PO TABS: 500.0000 mg | ORAL_TABLET | Freq: Every day | ORAL | 0 refills | Status: DC

## 2018-05-14 DIAGNOSIS — Z1281 Encounter for screening for malignant neoplasm of oral cavity: Secondary | ICD-10-CM | POA: Diagnosis not present

## 2018-05-14 DIAGNOSIS — K0500 Acute gingivitis, plaque induced: Secondary | ICD-10-CM | POA: Diagnosis not present

## 2018-06-09 ENCOUNTER — Other Ambulatory Visit: Payer: Self-pay | Admitting: Primary Care

## 2018-06-09 DIAGNOSIS — F411 Generalized anxiety disorder: Secondary | ICD-10-CM

## 2018-07-21 ENCOUNTER — Other Ambulatory Visit: Payer: Self-pay | Admitting: Primary Care

## 2018-07-21 DIAGNOSIS — B001 Herpesviral vesicular dermatitis: Secondary | ICD-10-CM

## 2018-09-12 ENCOUNTER — Other Ambulatory Visit: Payer: Self-pay | Admitting: Primary Care

## 2018-09-12 DIAGNOSIS — F411 Generalized anxiety disorder: Secondary | ICD-10-CM

## 2018-10-13 DIAGNOSIS — J40 Bronchitis, not specified as acute or chronic: Secondary | ICD-10-CM | POA: Diagnosis not present

## 2018-10-13 DIAGNOSIS — R05 Cough: Secondary | ICD-10-CM | POA: Diagnosis not present

## 2018-12-12 ENCOUNTER — Other Ambulatory Visit: Payer: Self-pay | Admitting: Primary Care

## 2018-12-12 DIAGNOSIS — F411 Generalized anxiety disorder: Secondary | ICD-10-CM

## 2018-12-18 ENCOUNTER — Other Ambulatory Visit: Payer: Self-pay

## 2018-12-18 DIAGNOSIS — F411 Generalized anxiety disorder: Secondary | ICD-10-CM

## 2018-12-19 MED ORDER — SERTRALINE HCL 100 MG PO TABS: 100.0000 mg | ORAL_TABLET | Freq: Every day | ORAL | 0 refills | Status: DC

## 2018-12-25 DIAGNOSIS — M65862 Other synovitis and tenosynovitis, left lower leg: Secondary | ICD-10-CM | POA: Diagnosis not present

## 2018-12-25 DIAGNOSIS — M2242 Chondromalacia patellae, left knee: Secondary | ICD-10-CM | POA: Diagnosis not present

## 2018-12-26 ENCOUNTER — Encounter: Payer: Self-pay | Admitting: *Deleted

## 2018-12-27 ENCOUNTER — Telehealth: Payer: Self-pay

## 2018-12-27 ENCOUNTER — Ambulatory Visit: Payer: BLUE CROSS/BLUE SHIELD | Admitting: Primary Care

## 2018-12-27 NOTE — Telephone Encounter (Signed)
[facility] Night - Client Nonclinical Telephone Record [facility] Client [facility] Night - Client Client Site [facility] - Night Physician Vernona Rieger - NP Contact Type Call Who Is Calling Patient / Member / Family / Caregiver Caller Name [name] Caller Phone Number [phone] Patient Name Sheri Ruiz Patient DOB Apr 16, 1986 Call Type Message Only Information Provided Reason for Call Request to Reschedule Office Appointment Initial Comment Caller needs to reschedule her appointment tomorrow 1/23 she won't be able to make it. Additional Comment Call Closed By: Caprice Kluver Transaction Date/Time: 12/26/2018 6:04:49 PM (ET)

## 2018-12-27 NOTE — Telephone Encounter (Signed)
Appt already changed to 12/31/18.

## 2018-12-31 ENCOUNTER — Ambulatory Visit: Payer: BLUE CROSS/BLUE SHIELD | Admitting: Primary Care

## 2018-12-31 ENCOUNTER — Encounter: Payer: Self-pay | Admitting: Primary Care

## 2018-12-31 DIAGNOSIS — F411 Generalized anxiety disorder: Secondary | ICD-10-CM | POA: Diagnosis not present

## 2018-12-31 DIAGNOSIS — I471 Supraventricular tachycardia: Secondary | ICD-10-CM | POA: Diagnosis not present

## 2018-12-31 DIAGNOSIS — B001 Herpesviral vesicular dermatitis: Secondary | ICD-10-CM | POA: Diagnosis not present

## 2018-12-31 DIAGNOSIS — G43821 Menstrual migraine, not intractable, with status migrainosus: Secondary | ICD-10-CM | POA: Diagnosis not present

## 2018-12-31 MED ORDER — DILTIAZEM HCL 30 MG PO TABS: 30.0000 mg | ORAL_TABLET | ORAL | 0 refills | Status: DC | PRN

## 2018-12-31 MED ORDER — PROPRANOLOL HCL 20 MG PO TABS: 20.0000 mg | ORAL_TABLET | ORAL | 0 refills | Status: DC | PRN

## 2018-12-31 MED ORDER — VALACYCLOVIR HCL 1 G PO TABS: ORAL_TABLET | ORAL | 0 refills | Status: DC

## 2018-12-31 MED ORDER — SERTRALINE HCL 50 MG PO TABS: 50.0000 mg | ORAL_TABLET | Freq: Every day | ORAL | 1 refills | Status: DC

## 2018-12-31 NOTE — Patient Instructions (Signed)
We've reduced the dose of your sertraline (Zoloft) to 50 mg. You may cut your current 100 mg tablets in half.   I've changed the valacyclovir medication to 1000 mg. Take 2 tablets twice daily for one day as needed for outbreaks.  Please update me regarding the sertraline (Zoloft) dose in a month.   It was a pleasure to see you today!

## 2018-12-31 NOTE — Assessment & Plan Note (Signed)
Infrequent, doing well on OTC treatment.  Continue to monitor.

## 2018-12-31 NOTE — Assessment & Plan Note (Signed)
No outbreak for the last two months. Will continue on PRN dosing for now, she will update if outbreaks return. Refill sent to pharmacy.

## 2018-12-31 NOTE — Progress Notes (Signed)
Subjective:    Patient ID: Sheri Ruiz, female    DOB: [birth year], 33 y.o.   MRN: [id number]  HPI  Sheri Ruiz is a 33 year old female who presents today for follow up.  1) Migraines: Intermittent, overall not bothersome. Migraines are located behind her eyes which will radiate to the parietal lobes. She will take Excedrin Migraine and sleep with resolve.   2) GAD: Currently managed on Zoloft 100 mg and hydroxyzine 25 mg PRN. She overall is feeling well managed, would like to wean down. She denies SI/HI. No recent use of hydroxyzine.   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 She is needing refills. She had no outbreaks when taking daily for three months in late Summer/Fall 2019. She did have an outbreak right after finishing her three month supply around November 2019, no outbreak since.   4) SVT: Currently managed on diltiazem 30 mg and propranolol PRN. She will take both of her medications at once PRN. She had one occurrence of SVT in 2019, no recent episodes. No recent cardiology visit given lack of episodes. She is needing refills today.  Review of Systems  Respiratory: Negative for shortness of breath.   Cardiovascular: Negative for chest pain and palpitations.  Neurological: Negative for dizziness.       Infrequent migraines   Psychiatric/Behavioral: Negative for suicidal ideas.       See HPI       Past Medical History:  Diagnosis Date  . Abnormal Pap smear of cervix   . Depression   . Hypokalemia   . Hypomagnesemia   . LGSIL (low grade squamous intraepithelial dysplasia)   . Migraine   . Oral herpes   . Preterm delivery      Social History   Socioeconomic History  . Marital status: Married    Spouse name: Not on file  . Number of children: Not on file  . Years of education: Not on file  . Highest education level: Not on file  Occupational History  . Not on file  Social Needs  . Financial resource  strain: Not on file  . Food insecurity:    Worry: Not on file    Inability: Not on file  . Transportation needs:    Medical: Not on file    Non-medical: Not on file  Tobacco Use  . Smoking status: Never Smoker  . Smokeless tobacco: Never Used  Substance and Sexual Activity  . Alcohol use: No    Alcohol/week: 0.0 standard drinks  . Drug use: No  . Sexual activity: Yes    Birth control/protection: None  Lifestyle  . Physical activity:    Days per week: Not on file    Minutes per session: Not on file  . Stress: Not on file  Relationships  . Social connections:    Talks on phone: Not on file    Gets together: Not on file    Attends religious service: Not on file    Active member of club or organization: Not on file    Attends meetings of clubs or organizations: Not on file    Relationship status: Not on file  . Intimate partner violence:    Fear of current or ex partner: Not on file    Emotionally abused: Not on file    Physically abused: Not on file    Forced sexual activity: Not on file  Other Topics Concern  . Not on  file  Social History Narrative   Married.   2 children.   Works as a Social worker.   Enjoys spending time with family.     Past Surgical History:  Procedure Laterality Date  . COLPOSCOPY    . TONSILLECTOMY      Family History  Problem Relation Age of Onset  . Transient ischemic attack Mother   . Hypertension Maternal Grandmother   . Clotting disorder Maternal Grandmother     Allergies  Allergen Reactions  . Amoxicillin Rash and Nausea And Vomiting    Other reaction(s): GI Intolerance, Nausea And Vomiting    Current Outpatient Medications on File Prior to Visit  Medication Sig Dispense Refill  . diltiazem (CARDIZEM) 30 MG tablet Take 1 tablet (30 mg total) by mouth as needed. 20 tablet 0  . hydrOXYzine (ATARAX/VISTARIL) 25 MG tablet Take 1 tablet (25 mg total) by mouth 2 (two) times daily as needed for anxiety. 30 tablet 0  . potassium  chloride (K-DUR) 10 MEQ tablet Take 1 tablet (10 mEq total) by mouth daily. 30 tablet 0  . propranolol (INDERAL) 20 MG tablet Take 1 tablet (20 mg total) by mouth as needed. 20 tablet 0  . sertraline (ZOLOFT) 100 MG tablet Take 1 tablet (100 mg total) by mouth daily. DUE FOR A FOLLOW UP APPOINTMENT 30 tablet 0  . valACYclovir (VALTREX) 500 MG tablet TAKE 1 TABLET BY MOUTH EVERY DAY 90 tablet 0   No current facility-administered medications on file prior to visit.     BP 104/64   Pulse 84   Temp 98 F (36.7 C) (Oral)   Ht 5\' 3"  (1.6 m)   Wt 194 lb 12 oz (88.3 kg)   SpO2 95%   BMI 34.50 kg/m    Objective:   Physical Exam  Constitutional: She appears well-nourished.  Neck: Neck supple.  Cardiovascular: Normal rate and regular rhythm.  Respiratory: Effort normal and breath sounds normal.  Skin: Skin is warm and dry.  Psychiatric: She has a normal mood and affect.           Assessment & Plan:

## 2018-12-31 NOTE — Assessment & Plan Note (Signed)
Doing well and would like to wean down off of 100 mg. Rx for 50 mg tablets sent to pharmacy. She will update.  Denies SI/HI.

## 2018-12-31 NOTE — Assessment & Plan Note (Signed)
One episode in 2019, none recently. Refill provided for diltiazem and propranolol.  Exam unremarkable.

## 2019-01-07 DIAGNOSIS — M6281 Muscle weakness (generalized): Secondary | ICD-10-CM | POA: Diagnosis not present

## 2019-01-07 DIAGNOSIS — M25562 Pain in left knee: Secondary | ICD-10-CM | POA: Diagnosis not present

## 2019-01-14 DIAGNOSIS — M25562 Pain in left knee: Secondary | ICD-10-CM | POA: Diagnosis not present

## 2019-01-14 DIAGNOSIS — M6281 Muscle weakness (generalized): Secondary | ICD-10-CM | POA: Diagnosis not present

## 2019-01-28 DIAGNOSIS — M25562 Pain in left knee: Secondary | ICD-10-CM | POA: Diagnosis not present

## 2019-01-28 DIAGNOSIS — M6281 Muscle weakness (generalized): Secondary | ICD-10-CM | POA: Diagnosis not present

## 2019-07-01 ENCOUNTER — Other Ambulatory Visit: Payer: Self-pay | Admitting: Primary Care

## 2019-07-01 DIAGNOSIS — F411 Generalized anxiety disorder: Secondary | ICD-10-CM

## 2019-07-01 NOTE — Telephone Encounter (Signed)
Tried to call pt to get update on Rx. Pt was told to f/u in a month for update.

## 2019-09-02 DIAGNOSIS — Z0000 Encounter for general adult medical examination without abnormal findings: Secondary | ICD-10-CM | POA: Diagnosis not present

## 2019-09-02 DIAGNOSIS — Z8742 Personal history of other diseases of the female genital tract: Secondary | ICD-10-CM | POA: Diagnosis not present

## 2019-09-02 DIAGNOSIS — Z01419 Encounter for gynecological examination (general) (routine) without abnormal findings: Secondary | ICD-10-CM | POA: Diagnosis not present

## 2019-09-02 DIAGNOSIS — Z131 Encounter for screening for diabetes mellitus: Secondary | ICD-10-CM | POA: Diagnosis not present

## 2019-09-02 DIAGNOSIS — Z13220 Encounter for screening for lipoid disorders: Secondary | ICD-10-CM | POA: Diagnosis not present

## 2019-09-02 DIAGNOSIS — R5383 Other fatigue: Secondary | ICD-10-CM | POA: Diagnosis not present

## 2019-10-30 ENCOUNTER — Other Ambulatory Visit: Payer: Self-pay

## 2019-10-30 DIAGNOSIS — Z20822 Contact with and (suspected) exposure to covid-19: Secondary | ICD-10-CM

## 2019-11-01 LAB — NOVEL CORONAVIRUS, NAA: SARS-CoV-2, NAA: NOT DETECTED

## 2020-01-06 ENCOUNTER — Other Ambulatory Visit: Payer: Self-pay | Admitting: Primary Care

## 2020-01-06 DIAGNOSIS — F411 Generalized anxiety disorder: Secondary | ICD-10-CM

## 2020-01-13 ENCOUNTER — Telehealth (INDEPENDENT_AMBULATORY_CARE_PROVIDER_SITE_OTHER): Payer: BC Managed Care – PPO | Admitting: Primary Care

## 2020-01-13 DIAGNOSIS — G43821 Menstrual migraine, not intractable, with status migrainosus: Secondary | ICD-10-CM | POA: Diagnosis not present

## 2020-01-13 DIAGNOSIS — B001 Herpesviral vesicular dermatitis: Secondary | ICD-10-CM

## 2020-01-13 DIAGNOSIS — F411 Generalized anxiety disorder: Secondary | ICD-10-CM

## 2020-01-13 DIAGNOSIS — I471 Supraventricular tachycardia: Secondary | ICD-10-CM

## 2020-01-13 MED ORDER — SUMATRIPTAN SUCCINATE 50 MG PO TABS: ORAL_TABLET | ORAL | 0 refills | Status: DC

## 2020-01-13 MED ORDER — SERTRALINE HCL 50 MG PO TABS: 50.0000 mg | ORAL_TABLET | Freq: Every day | ORAL | 3 refills | Status: DC

## 2020-01-13 NOTE — Assessment & Plan Note (Signed)
Infrequent outbreaks overall. Using valacyclovir as needed, continue same.

## 2020-01-13 NOTE — Assessment & Plan Note (Signed)
Doing well on Zoloft 50 mg daily, continue same. No recent use of hydroxyzine.

## 2020-01-13 NOTE — Telephone Encounter (Signed)
[facility] Night - Client Nonclinical Telephone Record [facility] Night - Client Client Site [facility] - Night Physician Vernona Rieger - NP Contact Type Call Who Is Calling Patient / Member / Family / Caregiver Caller Name CHERRIE FRANCA Caller Phone Number [phone] Patient Name Sheri Ruiz Patient DOB 02-08-1986 Call Type Message Only Information Provided Reason for Call Request for General Office Information Initial Comment Caller states she needs to r/s appt. for today Additional Comment Declines triage. Please call her to r/s appt. Disp. Time Disposition Final User 01/13/2020 8:05:04 AM General Information Provided Yes Osborne Casco Call Closed By: Osborne Casco Transaction Date/Time: 01/13/2020 8:03:24 AM (ET)

## 2020-01-13 NOTE — Assessment & Plan Note (Signed)
No recent episodes, continue to monitor.  

## 2020-01-13 NOTE — Assessment & Plan Note (Signed)
More frequent and cyclical since removal of IUD. She will reach out to GYN.  Rx for Imitrex provided. She will update.

## 2020-01-13 NOTE — Telephone Encounter (Signed)
Per chart review appears has been changed to mychart video visit 01/13/20 at 9:20. FYI to Mayra Reel NP and Johny Drilling CMA.

## 2020-01-13 NOTE — Progress Notes (Signed)
Subjective:    Patient ID: Sheri Ruiz, female    DOB: 1986-03-08, 34 y.o.   MRN: [id number]  HPI  Virtual Visit via Video Note  I connected with Sheri Ruiz on 01/13/20 at  9:20 AM EST by a video enabled telemedicine application and verified that I am speaking with the correct person using two identifiers.  Location: Patient: Home Provider: Office   I discussed the limitations of evaluation and management by telemedicine and the availability of in person appointments. The patient expressed understanding and agreed to proceed.  History of Present Illness:  This visit occurred during the SARS-CoV-2 public health emergency.  Safety protocols were in place, including screening questions prior to the visit, additional usage of staff PPE, and extensive cleaning of exam room while observing appropriate contact time as indicated for disinfecting solutions.   Sheri Ruiz is a 34 year old female who presents today for follow up.  1) Migraines: Infrequent overall but more frequently since IUD was removed in October 2020. Migraines are cyclical and during menses, sometimes will have 2-3 migraines weekly during menses. She is considering reaching back out to GYN regarding headaches/migraines.   She is currently taking Excedrin Migraine with some improvement but not resolve. She does not have Rx abortive treatment at home.   2) GAD: Currently managed on sertraline 50 mg daily and hydroxyzine 25 mg PRN. Overall feels well managed, doing well.  3) SVT: Infrequent episodes, using propranolol 20 mg and diltiazem 30 mg as needed. No recent episodes.  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    Observations/Objective:  Alert and oriented. Appears well, not sickly. No distress. Speaking in complete sentences.   Assessment and Plan:  See problem based charting.  Follow Up Instructions:  You can try sumatriptan (Imitrex) 50 mg for  migraine abortion. Take 1 tablet by mouth at migraine onset. You may repeat with a second tablet 2 hours later if needed.  Please update me regarding migraines.  It was a pleasure to see you today! Sheri Bossier, NP-C    I discussed the assessment and treatment plan with the patient. The patient was provided an opportunity to ask questions and all were answered. The patient agreed with the plan and demonstrated an understanding of the instructions.   The patient was advised to call back or seek an in-person evaluation if the symptoms worsen or if the condition fails to improve as anticipated.    Sheri Koch, NP    Review of Systems  Respiratory: Negative for shortness of breath.   Cardiovascular: Negative for chest pain.  Neurological: Positive for headaches.  Psychiatric/Behavioral: The patient is not nervous/anxious.        Past Medical History:  Diagnosis Date  . Abnormal Pap smear of cervix   . Depression   . Hypokalemia   . Hypomagnesemia   . LGSIL (low grade squamous intraepithelial dysplasia)   . Migraine   . Oral herpes   . Preterm delivery      Social History   Socioeconomic History  . Marital status: Married    Spouse name: Not on file  . Number of children: Not on file  . Years of education: Not on file  . Highest education level: Not on file  Occupational History  . Not on file  Tobacco Use  . Smoking status: Never Smoker  . Smokeless tobacco: Never Used  Substance and Sexual Activity  . Alcohol use: No  Alcohol/week: 0.0 standard drinks  . Drug use: No  . Sexual activity: Yes    Birth control/protection: None  Other Topics Concern  . Not on file  Social History Narrative   Married.   2 children.   Works as a Social worker.   Enjoys spending time with family.    Social Determinants of Health   Financial Resource Strain:   . Difficulty of Paying Living Expenses: Not on file  Food Insecurity:   . Worried About Programme researcher, broadcasting/film/video in  the Last Year: Not on file  . Ran Out of Food in the Last Year: Not on file  Transportation Needs:   . Lack of Transportation (Medical): Not on file  . Lack of Transportation (Non-Medical): Not on file  Physical Activity:   . Days of Exercise per Week: Not on file  . Minutes of Exercise per Session: Not on file  Stress:   . Feeling of Stress : Not on file  Social Connections:   . Frequency of Communication with Friends and Family: Not on file  . Frequency of Social Gatherings with Friends and Family: Not on file  . Attends Religious Services: Not on file  . Active Member of Clubs or Organizations: Not on file  . Attends Banker Meetings: Not on file  . Marital Status: Not on file  Intimate Partner Violence:   . Fear of Current or Ex-Partner: Not on file  . Emotionally Abused: Not on file  . Physically Abused: Not on file  . Sexually Abused: Not on file    Past Surgical History:  Procedure Laterality Date  . COLPOSCOPY    . TONSILLECTOMY      Family History  Problem Relation Age of Onset  . Transient ischemic attack Mother   . Hypertension Maternal Grandmother   . Clotting disorder Maternal Grandmother     Allergies  Allergen Reactions  . Amoxicillin Rash and Nausea And Vomiting    Other reaction(s): GI Intolerance, Nausea And Vomiting    Current Outpatient Medications on File Prior to Visit  Medication Sig Dispense Refill  . diltiazem (CARDIZEM) 30 MG tablet Take 1 tablet (30 mg total) by mouth as needed. (Patient not taking: Reported on 01/13/2020) 20 tablet 0  . hydrOXYzine (ATARAX/VISTARIL) 25 MG tablet Take 1 tablet (25 mg total) by mouth 2 (two) times daily as needed for anxiety. (Patient not taking: Reported on 01/13/2020) 30 tablet 0  . propranolol (INDERAL) 20 MG tablet Take 1 tablet (20 mg total) by mouth as needed. (Patient not taking: Reported on 01/13/2020) 20 tablet 0  . valACYclovir (VALTREX) 1000 MG tablet Take 2 tablets twice daily for one day as  needed for outbreaks. (Patient not taking: Reported on 01/13/2020) 12 tablet 0   No current facility-administered medications on file prior to visit.    There were no vitals taken for this visit.   Objective:   Physical Exam  Constitutional: She is oriented to person, place, and time. She appears well-nourished.  Respiratory: Effort normal.  Neurological: She is alert and oriented to person, place, and time.  Psychiatric: She has a normal mood and affect.           Assessment & Plan:

## 2020-01-13 NOTE — Patient Instructions (Signed)
You can try sumatriptan (Imitrex) 50 mg for migraine abortion. Take 1 tablet by mouth at migraine onset. You may repeat with a second tablet 2 hours later if needed.  Please update me regarding migraines.  It was a pleasure to see you today! Mayra Reel, NP-C

## 2020-01-17 ENCOUNTER — Other Ambulatory Visit: Payer: Self-pay

## 2020-01-17 DIAGNOSIS — B001 Herpesviral vesicular dermatitis: Secondary | ICD-10-CM

## 2020-01-17 MED ORDER — VALACYCLOVIR HCL 1 G PO TABS: ORAL_TABLET | ORAL | 0 refills | Status: DC

## 2020-04-24 ENCOUNTER — Encounter: Payer: Self-pay | Admitting: Medical Oncology

## 2020-04-24 ENCOUNTER — Emergency Department (HOSPITAL_COMMUNITY)
Admission: EM | Admit: 2020-04-24 | Discharge: 2020-04-24 | Disposition: A | Discharge status: Home / Self Care | Payer: BC Managed Care – PPO | Attending: Emergency Medicine | Admitting: Emergency Medicine

## 2020-04-24 ENCOUNTER — Emergency Department
Admission: EM | Admit: 2020-04-24 | Discharge: 2020-04-24 | Disposition: A | Discharge status: Home / Self Care | Payer: BC Managed Care – PPO | Attending: Emergency Medicine | Admitting: Emergency Medicine

## 2020-04-24 ENCOUNTER — Other Ambulatory Visit: Payer: Self-pay

## 2020-04-24 ENCOUNTER — Encounter (HOSPITAL_COMMUNITY): Payer: Self-pay | Admitting: Emergency Medicine

## 2020-04-24 DIAGNOSIS — Z5321 Procedure and treatment not carried out due to patient leaving prior to being seen by health care provider: Secondary | ICD-10-CM | POA: Insufficient documentation

## 2020-04-24 DIAGNOSIS — S060X0A Concussion without loss of consciousness, initial encounter: Secondary | ICD-10-CM | POA: Insufficient documentation

## 2020-04-24 DIAGNOSIS — Y999 Unspecified external cause status: Secondary | ICD-10-CM | POA: Insufficient documentation

## 2020-04-24 DIAGNOSIS — R519 Headache, unspecified: Secondary | ICD-10-CM | POA: Diagnosis not present

## 2020-04-24 DIAGNOSIS — W010XXA Fall on same level from slipping, tripping and stumbling without subsequent striking against object, initial encounter: Secondary | ICD-10-CM | POA: Diagnosis not present

## 2020-04-24 DIAGNOSIS — Y93E1 Activity, personal bathing and showering: Secondary | ICD-10-CM | POA: Diagnosis not present

## 2020-04-24 DIAGNOSIS — Z79899 Other long term (current) drug therapy: Secondary | ICD-10-CM | POA: Insufficient documentation

## 2020-04-24 DIAGNOSIS — S76019A Strain of muscle, fascia and tendon of unspecified hip, initial encounter: Secondary | ICD-10-CM | POA: Diagnosis not present

## 2020-04-24 DIAGNOSIS — Y92002 Bathroom of unspecified non-institutional (private) residence single-family (private) house as the place of occurrence of the external cause: Secondary | ICD-10-CM | POA: Insufficient documentation

## 2020-04-24 DIAGNOSIS — S76012A Strain of muscle, fascia and tendon of left hip, initial encounter: Secondary | ICD-10-CM | POA: Diagnosis not present

## 2020-04-24 DIAGNOSIS — S0990XA Unspecified injury of head, initial encounter: Secondary | ICD-10-CM | POA: Diagnosis not present

## 2020-04-24 MED ORDER — CYCLOBENZAPRINE HCL 10 MG PO TABS
5.0000 mg | ORAL_TABLET | Freq: Once | ORAL | Status: AC
  Administered 2020-04-24: 5 mg via ORAL
  Filled 2020-04-24: qty 1

## 2020-04-24 MED ORDER — CYCLOBENZAPRINE HCL 5 MG PO TABS
5.0000 mg | ORAL_TABLET | Freq: Three times a day (TID) | ORAL | 0 refills | Status: DC | PRN
Start: 2020-04-24 — End: 2021-02-25

## 2020-04-24 MED ORDER — METOCLOPRAMIDE HCL 10 MG PO TABS
10.0000 mg | ORAL_TABLET | Freq: Once | ORAL | Status: AC
  Administered 2020-04-24: 10 mg via ORAL
  Filled 2020-04-24: qty 1

## 2020-04-24 MED ORDER — METOCLOPRAMIDE HCL 10 MG PO TABS
10.0000 mg | ORAL_TABLET | Freq: Four times a day (QID) | ORAL | 0 refills | Status: DC | PRN
Start: 2020-04-24 — End: 2021-02-25

## 2020-04-24 NOTE — ED Notes (Signed)
Pt to front desk, states that her husband wants her to be taken to [facility] instead of being seen here. Pt in no distress.

## 2020-04-24 NOTE — ED Triage Notes (Signed)
Pt arrives to ED with c/o of falling in shower yesterday while leaning to try to not wet her hair. Pt states she did not think she hit her head and did not have LOC. Pt states there is a bench in her shower and hit bottom hit that. Pt states today she feels nauseous and has a headache.  Pt is alert and ox4. No neuro deficits. No dizzy or change in vision.

## 2020-04-24 NOTE — Discharge Instructions (Signed)
Take tylenol, motrin for pain   Take flexeril for muscle spasms   Take reglan for nausea and headaches   See your doctor  Return to ER if you have worse headaches, vomiting, worse hip pain

## 2020-04-24 NOTE — ED Notes (Signed)
Pt verbalized understanding of discharge instructions. Follow up care and prescriptions reviewed, pt had no further questions. 

## 2020-04-24 NOTE — ED Provider Notes (Signed)
[facility] EMERGENCY DEPARTMENT Provider Note   CSN: [id number] Arrival date & time: 04/24/20  1238     History Chief Complaint  Patient presents with  . Fall    Sheri Ruiz is a 34 y.o. female history of depression here presenting with fall with headache.  Patient states that yesterday, she fell in the shower and hit her bottom and may have hit her head.  She states that she has pain in her buttock but was able to ambulate afterwards.  Today she started having some nausea and headaches.  She went to [facility] but left without being seen and came here.  She is not on any blood thinners.  She denies any actual vomiting.  She denies any focal weakness or numbness.  The history is provided by the patient.       Past Medical History:  Diagnosis Date  . Abnormal Pap smear of cervix   . Depression   . Hypokalemia   . Hypomagnesemia   . LGSIL (low grade squamous intraepithelial dysplasia)   . Migraine   . Oral herpes   . Preterm delivery     Patient Active Problem List   Diagnosis Date Noted  . SVT (supraventricular tachycardia) (HCC) 01/15/2017  . Elevated troponin   . GAD (generalized anxiety disorder) 07/20/2016  . Migraines 07/20/2016  . Herpes labialis 07/20/2016    Past Surgical History:  Procedure Laterality Date  . COLPOSCOPY    . TONSILLECTOMY       OB History    Gravida  2   Para  2   Term  1   Preterm  1   AB  0   Living  1     SAB  0   TAB  0   Ectopic  0   Multiple  0   Live Births  1           Family History  Problem Relation Age of Onset  . Transient ischemic attack Mother   . Hypertension Maternal Grandmother   . Clotting disorder Maternal Grandmother     Social History   Tobacco Use  . Smoking status: Never Smoker  . Smokeless tobacco: Never Used  Substance Use Topics  . Alcohol use: No    Alcohol/week: 0.0 standard drinks  . Drug use: No    Home Medications Prior to Admission medications    Medication Sig Start Date End Date Taking? Authorizing Provider  cyclobenzaprine (FLEXERIL) 5 MG tablet Take 1 tablet (5 mg total) by mouth 3 (three) times daily as needed. 04/24/20   Charlynne Pander, MD  diltiazem (CARDIZEM) 30 MG tablet Take 1 tablet (30 mg total) by mouth as needed. Patient not taking: Reported on 01/13/2020 12/31/18   Doreene Nest, NP  hydrOXYzine (ATARAX/VISTARIL) 25 MG tablet Take 1 tablet (25 mg total) by mouth 2 (two) times daily as needed for anxiety. Patient not taking: Reported on 01/13/2020 07/20/16   Doreene Nest, NP  metoCLOPramide (REGLAN) 10 MG tablet Take 1 tablet (10 mg total) by mouth every 6 (six) hours as needed for nausea (nausea/headache). 04/24/20   Charlynne Pander, MD  propranolol (INDERAL) 20 MG tablet Take 1 tablet (20 mg total) by mouth as needed. Patient not taking: Reported on 01/13/2020 12/31/18   Doreene Nest, NP  sertraline (ZOLOFT) 50 MG tablet Take 1 tablet (50 mg total) by mouth daily. For anxiety. 01/13/20   Doreene Nest, NP  SUMAtriptan (IMITREX) 50  MG tablet Take 1 tablet by mouth at migraine onset. May repeat in 2 hours if headache persists or recurs. 01/13/20   [name], NP  valACYclovir (VALTREX) 1000 MG tablet Take 2 tablets twice daily for one day as needed for outbreaks. 01/17/20   [name], NP    Allergies    Amoxicillin  Review of Systems   Review of Systems  Neurological: Positive for headaches.  All other systems reviewed and are negative.   Physical Exam Updated Vital Signs BP (!) 114/56 (BP Location: Right Arm)   Pulse 75   Temp 98.6 F (37 C) (Oral)   Resp 17   Ht 5\' 2"  (1.575 m)   Wt 86.2 kg   LMP 04/20/2020 (Exact Date)   SpO2 100%   BMI 34.75 kg/m   Physical Exam Vitals and nursing note reviewed.  HENT:     Head: Normocephalic.     Comments: No scalp hematoma, no laceration     Nose: Nose normal.     Mouth/Throat:     Mouth: Mucous membranes are moist.  Eyes:      Extraocular Movements: Extraocular movements intact.     Pupils: Pupils are equal, round, and reactive to light.  Cardiovascular:     Rate and Rhythm: Normal rate and regular rhythm.     Pulses: Normal pulses.     Heart sounds: Normal heart sounds.  Pulmonary:     Effort: Pulmonary effort is normal.     Breath sounds: Normal breath sounds.  Abdominal:     General: Abdomen is flat.     Palpations: Abdomen is soft.  Musculoskeletal:        General: Normal range of motion.     Cervical back: Normal range of motion.     Comments: Nl ROM bilateral hip, mild L buttock tenderness, no midline spinal tenderness, no obvious extremity trauma   Skin:    General: Skin is warm.     Capillary Refill: Capillary refill takes less than 2 seconds.  Neurological:     General: No focal deficit present.     Mental Status: She is alert and oriented to person, place, and time.     Cranial Nerves: No cranial nerve deficit.     Sensory: No sensory deficit.     Motor: No weakness.     Coordination: Coordination normal.  Psychiatric:        Mood and Affect: Mood normal.        Behavior: Behavior normal.     ED Results / Procedures / Treatments   Labs (all labs ordered are listed, but only abnormal results are displayed) Labs Reviewed - No data to display  EKG None  Radiology No results found.  Procedures Procedures (including critical care time)  Medications Ordered in ED Medications  cyclobenzaprine (FLEXERIL) tablet 5 mg (5 mg Oral Given 04/24/20 1846)  metoCLOPramide (REGLAN) tablet 10 mg (10 mg Oral Given 04/24/20 1846)    ED Course  I have reviewed the triage vital signs and the nursing notes.  Pertinent labs & imaging results that were available during my care of the patient were reviewed by me and considered in my medical decision making (see chart for details).    MDM Rules/Calculators/A&P                      Sheri Ruiz is a 34 y.o. female your presenting with head  injury and left buttock injury.  Happened  about 24 hours ago.  She has some dizziness but she has normal strength and sensation and no focal neuro deficits.  She is also not on blood thinners.  She is low risk so will hold off on CT head.  She she does have left bladder injury but she has been walking on it and I doubt any pelvic or hip fractures.  I think likely mild concussion and muscle strain.  Recommend Tylenol and Reglan as needed.  Recommend Flexeril as needed for muscle spasms.   Final Clinical Impression(s) / ED Diagnoses Final diagnoses:  Concussion without loss of consciousness, initial encounter  Hip strain, initial encounter    Rx / DC Orders ED Discharge Orders         Ordered    cyclobenzaprine (FLEXERIL) 5 MG tablet  3 times daily PRN     04/24/20 1839    metoCLOPramide (REGLAN) 10 MG tablet  Every 6 hours PRN     04/24/20 1839           Charlynne Pander, MD 04/24/20 [phone]

## 2020-04-24 NOTE — ED Triage Notes (Signed)
Pt ambulatory to triage states that she slipped and fell yesterday in the shower and since then she has had a headache. Pt reports soreness to back of head. Denies loc.

## 2020-08-12 DIAGNOSIS — N898 Other specified noninflammatory disorders of vagina: Secondary | ICD-10-CM | POA: Diagnosis not present

## 2020-08-12 DIAGNOSIS — N949 Unspecified condition associated with female genital organs and menstrual cycle: Secondary | ICD-10-CM | POA: Diagnosis not present

## 2020-08-12 DIAGNOSIS — R300 Dysuria: Secondary | ICD-10-CM | POA: Diagnosis not present

## 2020-10-01 DIAGNOSIS — R5383 Other fatigue: Secondary | ICD-10-CM | POA: Diagnosis not present

## 2020-10-01 DIAGNOSIS — Z0000 Encounter for general adult medical examination without abnormal findings: Secondary | ICD-10-CM | POA: Diagnosis not present

## 2020-10-01 DIAGNOSIS — Z01419 Encounter for gynecological examination (general) (routine) without abnormal findings: Secondary | ICD-10-CM | POA: Diagnosis not present

## 2020-10-01 DIAGNOSIS — Z1331 Encounter for screening for depression: Secondary | ICD-10-CM | POA: Diagnosis not present

## 2020-10-12 DIAGNOSIS — Z0000 Encounter for general adult medical examination without abnormal findings: Secondary | ICD-10-CM | POA: Diagnosis not present

## 2020-10-12 DIAGNOSIS — E559 Vitamin D deficiency, unspecified: Secondary | ICD-10-CM | POA: Diagnosis not present

## 2020-10-12 DIAGNOSIS — Z131 Encounter for screening for diabetes mellitus: Secondary | ICD-10-CM | POA: Diagnosis not present

## 2020-10-12 DIAGNOSIS — Z13220 Encounter for screening for lipoid disorders: Secondary | ICD-10-CM | POA: Diagnosis not present

## 2020-10-12 DIAGNOSIS — R5383 Other fatigue: Secondary | ICD-10-CM | POA: Diagnosis not present

## 2020-12-04 DIAGNOSIS — U071 COVID-19: Secondary | ICD-10-CM | POA: Diagnosis not present

## 2020-12-04 DIAGNOSIS — Z03818 Encounter for observation for suspected exposure to other biological agents ruled out: Secondary | ICD-10-CM | POA: Diagnosis not present

## 2020-12-04 DIAGNOSIS — Z20822 Contact with and (suspected) exposure to covid-19: Secondary | ICD-10-CM | POA: Diagnosis not present

## 2021-01-21 ENCOUNTER — Other Ambulatory Visit: Payer: Self-pay | Admitting: Primary Care

## 2021-01-21 DIAGNOSIS — F411 Generalized anxiety disorder: Secondary | ICD-10-CM

## 2021-01-21 NOTE — Telephone Encounter (Signed)
Patient needs in person office visit for further refills. Please schedule, will need this in order to continue medication.

## 2021-01-21 NOTE — Telephone Encounter (Signed)
Viewed scheduled and patient scheduled for cpe.

## 2021-01-21 NOTE — Telephone Encounter (Signed)
Called patient to schedule in office OV. LVM to call back as per DPR.

## 2021-01-29 DIAGNOSIS — Z03818 Encounter for observation for suspected exposure to other biological agents ruled out: Secondary | ICD-10-CM | POA: Diagnosis not present

## 2021-01-29 DIAGNOSIS — Z20822 Contact with and (suspected) exposure to covid-19: Secondary | ICD-10-CM | POA: Diagnosis not present

## 2021-02-04 ENCOUNTER — Other Ambulatory Visit: Payer: Self-pay | Admitting: Primary Care

## 2021-02-04 DIAGNOSIS — F411 Generalized anxiety disorder: Secondary | ICD-10-CM

## 2021-02-25 ENCOUNTER — Encounter: Payer: Self-pay | Admitting: Primary Care

## 2021-02-25 ENCOUNTER — Ambulatory Visit (INDEPENDENT_AMBULATORY_CARE_PROVIDER_SITE_OTHER): Payer: BC Managed Care – PPO | Admitting: Primary Care

## 2021-02-25 ENCOUNTER — Other Ambulatory Visit: Payer: Self-pay

## 2021-02-25 VITALS — BP 108/68 | HR 69 | Temp 98.0°F | Ht 63.25 in | Wt 196.5 lb

## 2021-02-25 DIAGNOSIS — B001 Herpesviral vesicular dermatitis: Secondary | ICD-10-CM

## 2021-02-25 DIAGNOSIS — G43829 Menstrual migraine, not intractable, without status migrainosus: Secondary | ICD-10-CM

## 2021-02-25 DIAGNOSIS — Z0001 Encounter for general adult medical examination with abnormal findings: Secondary | ICD-10-CM | POA: Insufficient documentation

## 2021-02-25 DIAGNOSIS — Z0000 Encounter for general adult medical examination without abnormal findings: Secondary | ICD-10-CM | POA: Insufficient documentation

## 2021-02-25 DIAGNOSIS — I471 Supraventricular tachycardia, unspecified: Secondary | ICD-10-CM

## 2021-02-25 DIAGNOSIS — F411 Generalized anxiety disorder: Secondary | ICD-10-CM | POA: Diagnosis not present

## 2021-02-25 MED ORDER — SERTRALINE HCL 100 MG PO TABS: 100.0000 mg | ORAL_TABLET | Freq: Every day | ORAL | 2 refills | Status: DC

## 2021-02-25 MED ORDER — VALACYCLOVIR HCL 1 G PO TABS: ORAL_TABLET | ORAL | 0 refills | Status: DC

## 2021-02-25 NOTE — Assessment & Plan Note (Signed)
Current outbreak, but aside from today no recent outbreak. Refills of Valtrex provided.

## 2021-02-25 NOTE — Assessment & Plan Note (Addendum)
Chronic symptoms of difficulty focusing for 1-2 years which is about the time we reduced her Zoloft to 50 mg. Discussed this today.  She agrees to a dose increase of Zoloft back up to 100 mg to see if this helps with symptoms. She has several 50 mg in her bottle, will increase to 75 mg x 1-2 weeks, then increase to 100 mg thereafter.  Refill for 100 mg dose sent to pharmacy. She will update.

## 2021-02-25 NOTE — Patient Instructions (Signed)
Start exercising. You should be getting 150 minutes of moderate intensity exercise weekly.  It's important to improve your diet by reducing consumption of fast food, fried food, processed snack foods, sugary drinks. Increase consumption of fresh vegetables and fruits, whole grains, water.  Ensure you are drinking 64 ounces of water daily.  We increased your Zoloft back up to 100 mg. Take 1 and 1/2 tablet daily for 1-2 weeks, then increase to 2 tablets thereafter until your bottle is empty. I sent a prescription for 100 mg to the pharmacy, only take one of these once daily.   It was a pleasure to see you today!   Preventive Care 53-73 Years Old, Female Preventive care refers to lifestyle choices and visits with your health care provider that can promote health and wellness. This includes:  A yearly physical exam. This is also called an annual wellness visit.  Regular dental and eye exams.  Immunizations.  Screening for certain conditions.  Healthy lifestyle choices, such as: ? Eating a healthy diet. ? Getting regular exercise. ? Not using drugs or products that contain nicotine and tobacco. ? Limiting alcohol use. What can I expect for my preventive care visit? Physical exam Your health care provider may check your:  Height and weight. These may be used to calculate your BMI (body mass index). BMI is a measurement that tells if you are at a healthy weight.  Heart rate and blood pressure.  Body temperature.  Skin for abnormal spots. Counseling Your health care provider may ask you questions about your:  Past medical problems.  Family's medical history.  Alcohol, tobacco, and drug use.  Emotional well-being.  Home life and relationship well-being.  Sexual activity.  Diet, exercise, and sleep habits.  Work and work Statistician.  Access to firearms.  Method of birth control.  Menstrual cycle.  Pregnancy history. What immunizations do I need? Vaccines are  usually given at various ages, according to a schedule. Your health care provider will recommend vaccines for you based on your age, medical history, and lifestyle or other factors, such as travel or where you work.   What tests do I need? Blood tests  Lipid and cholesterol levels. These may be checked every 5 years starting at age 47.  Hepatitis C test.  Hepatitis B test. Screening  Diabetes screening. This is done by checking your blood sugar (glucose) after you have not eaten for a while (fasting).  STD (sexually transmitted disease) testing, if you are at risk.  BRCA-related cancer screening. This may be done if you have a family history of breast, ovarian, tubal, or peritoneal cancers.  Pelvic exam and Pap test. This may be done every 3 years starting at age 31. Starting at age 32, this may be done every 5 years if you have a Pap test in combination with an HPV test. Talk with your health care provider about your test results, treatment options, and if necessary, the need for more tests.   Follow these instructions at home: Eating and drinking  Eat a healthy diet that includes fresh fruits and vegetables, whole grains, lean protein, and low-fat dairy products.  Take vitamin and mineral supplements as recommended by your health care provider.  Do not drink alcohol if: ? Your health care provider tells you not to drink. ? You are pregnant, may be pregnant, or are planning to become pregnant.  If you drink alcohol: ? Limit how much you have to 0-1 drink a day. ? Be aware of  how much alcohol is in your drink. In the [place] one drink equals one 12 oz bottle of beer (355 mL), one 5 oz glass of wine (148 mL), or one 1 oz glass of hard liquor (44 mL).   Lifestyle  Take daily care of your teeth and gums. Brush your teeth every morning and night with fluoride toothpaste. Floss one time each day.  Stay active. Exercise for at least 30 minutes 5 or more days each week.  Do not use any  products that contain nicotine or tobacco, such as cigarettes, e-cigarettes, and chewing tobacco. If you need help quitting, ask your health care provider.  Do not use drugs.  If you are sexually active, practice safe sex. Use a condom or other form of protection to prevent STIs (sexually transmitted infections).  If you do not wish to become pregnant, use a form of birth control. If you plan to become pregnant, see your health care provider for a prepregnancy visit.  Find healthy ways to cope with stress, such as: ? Meditation, yoga, or listening to music. ? Journaling. ? Talking to a trusted person. ? Spending time with friends and family. Safety  Always wear your seat belt while driving or riding in a vehicle.  Do not drive: ? If you have been drinking alcohol. Do not ride with someone who has been drinking. ? When you are tired or distracted. ? While texting.  Wear a helmet and other protective equipment during sports activities.  If you have firearms in your house, make sure you follow all gun safety procedures.  Seek help if you have been physically or sexually abused. What's next?  Go to your health care provider once a year for an annual wellness visit.  Ask your health care provider how often you should have your eyes and teeth checked.  Stay up to date on all vaccines. This information is not intended to replace advice given to you by your health care provider. Make sure you discuss any questions you have with your health care provider. Document Revised: 07/19/2020 Document Reviewed: 08/02/2018 Elsevier Patient Education  2021 [employer].

## 2021-02-25 NOTE — Progress Notes (Signed)
Subjective:    Patient ID: Sheri Ruiz, female    DOB: 1986-06-09, 35 y.o.   MRN: [id number]  HPI  Sheri Ruiz is a very pleasant 35 y.o. female who presents today for complete physical.  She would also like to discuss difficulty focusing. Symptoms include inability to complete a task, forgets what she goes into a room for, cannot get things done, easily distracted. Symptoms began about 1-2 years ago. She did reduce her Zoloft dose from 100 mg to 50 mg about that time. She denies feeling increased anxiety.   Immunizations: -Tetanus: 2013 -Influenza: Declines  -Covid-19: Declines   Diet: She endorses a fair diet.  Exercise: She not exercising.    Eye exam: No recent exam Dental exam: every 6 months   Pap Smear: 2020  BP Readings from Last 3 Encounters:  02/25/21 108/68  04/24/20 111/75  04/24/20 113/64      Review of Systems  Constitutional: Negative for unexpected weight change.  HENT: Negative for rhinorrhea.   Eyes: Negative for visual disturbance.  Respiratory: Negative for shortness of breath.   Cardiovascular: Negative for chest pain.  Gastrointestinal: Negative for constipation and diarrhea.  Genitourinary: Negative for difficulty urinating.  Musculoskeletal: Negative for arthralgias and myalgias.  Skin: Negative for rash.  Allergic/Immunologic: Positive for environmental allergies.  Neurological: Negative for dizziness and headaches.  Psychiatric/Behavioral: The patient is not nervous/anxious.          Past Medical History:  Diagnosis Date  . Abnormal Pap smear of cervix   . Depression   . Hypokalemia   . Hypomagnesemia   . LGSIL (low grade squamous intraepithelial dysplasia)   . Migraine   . Oral herpes   . Preterm delivery     Social History   Socioeconomic History  . Marital status: Married    Spouse name: Not on file  . Number of children: Not on file  . Years of education: Not on file  . Highest education level: Not on file   Occupational History  . Not on file  Tobacco Use  . Smoking status: Never Smoker  . Smokeless tobacco: Never Used  Substance and Sexual Activity  . Alcohol use: No    Alcohol/week: 0.0 standard drinks  . Drug use: No  . Sexual activity: Yes    Birth control/protection: None  Other Topics Concern  . Not on file  Social History Narrative   Married.   2 children.   Works as a Social worker.   Enjoys spending time with family.    Social Determinants of Health   Financial Resource Strain: Not on file  Food Insecurity: Not on file  Transportation Needs: Not on file  Physical Activity: Not on file  Stress: Not on file  Social Connections: Not on file  Intimate Partner Violence: Not on file    Past Surgical History:  Procedure Laterality Date  . COLPOSCOPY    . TONSILLECTOMY      Family History  Problem Relation Age of Onset  . Transient ischemic attack Mother   . Hypertension Maternal Grandmother   . Clotting disorder Maternal Grandmother     Allergies  Allergen Reactions  . Amoxicillin Rash and Nausea And Vomiting    Other reaction(s): GI Intolerance, Nausea And Vomiting    Current Outpatient Medications on File Prior to Visit  Medication Sig Dispense Refill  . hydrOXYzine (ATARAX/VISTARIL) 25 MG tablet Take 1 tablet (25 mg total) by mouth 2 (two) times daily as needed for  anxiety. 30 tablet 0  . sertraline (ZOLOFT) 50 MG tablet TAKE 1 TABLET (50 MG TOTAL) BY MOUTH DAILY. FOR ANXIETY. 30 tablet 0  . valACYclovir (VALTREX) 1000 MG tablet Take 2 tablets twice daily for one day as needed for outbreaks. 12 tablet 0  . cyclobenzaprine (FLEXERIL) 5 MG tablet Take 1 tablet (5 mg total) by mouth 3 (three) times daily as needed. (Patient not taking: Reported on 02/25/2021) 10 tablet 0  . diltiazem (CARDIZEM) 30 MG tablet Take 1 tablet (30 mg total) by mouth as needed. (Patient not taking: No sig reported) 20 tablet 0  . metoCLOPramide (REGLAN) 10 MG tablet Take 1 tablet (10  mg total) by mouth every 6 (six) hours as needed for nausea (nausea/headache). (Patient not taking: Reported on 02/25/2021) 10 tablet 0  . propranolol (INDERAL) 20 MG tablet Take 1 tablet (20 mg total) by mouth as needed. (Patient not taking: No sig reported) 20 tablet 0  . SUMAtriptan (IMITREX) 50 MG tablet Take 1 tablet by mouth at migraine onset. May repeat in 2 hours if headache persists or recurs. (Patient not taking: Reported on 02/25/2021) 10 tablet 0   No current facility-administered medications on file prior to visit.    BP 108/68   Pulse 69   Temp 98 F (36.7 C) (Temporal)   Ht 5' 3.25" (1.607 m)   Wt 196 lb 8 oz (89.1 kg)   SpO2 99%   BMI 34.53 kg/m  Objective:   Physical Exam HENT:     Right Ear: Tympanic membrane and ear canal normal.     Left Ear: Tympanic membrane and ear canal normal.     Nose: Nose normal.  Eyes:     Conjunctiva/sclera: Conjunctivae normal.     Pupils: Pupils are equal, round, and reactive to light.  Neck:     Thyroid: No thyromegaly.  Cardiovascular:     Rate and Rhythm: Normal rate and regular rhythm.     Heart sounds: No murmur heard.   Pulmonary:     Effort: Pulmonary effort is normal.     Breath sounds: Normal breath sounds. No rales.  Abdominal:     General: Bowel sounds are normal.     Palpations: Abdomen is soft.     Tenderness: There is no abdominal tenderness.  Musculoskeletal:        General: Normal range of motion.     Cervical back: Neck supple.  Lymphadenopathy:     Cervical: No cervical adenopathy.  Skin:    General: Skin is warm and dry.     Findings: No rash.  Neurological:     Mental Status: She is alert and oriented to person, place, and time.     Cranial Nerves: No cranial nerve deficit.     Deep Tendon Reflexes: Reflexes are normal and symmetric.  Psychiatric:        Mood and Affect: Mood normal.           Assessment & Plan:      This visit occurred during the SARS-CoV-2 public health emergency.   Safety protocols were in place, including screening questions prior to the visit, additional usage of staff PPE, and extensive cleaning of exam room while observing appropriate contact time as indicated for disinfecting solutions.

## 2021-02-25 NOTE — Assessment & Plan Note (Signed)
No recent episodes, continue to monitor.

## 2021-02-25 NOTE — Assessment & Plan Note (Signed)
Declines influenza vaccine and Covid vaccines.  Pap smear UTD.  Discussed the importance of a healthy diet and regular exercise in order for weight loss, and to reduce the risk of any potential medical problems.  Exam today stable. Labs reviewed from [facility] Everywhere.

## 2021-02-25 NOTE — Assessment & Plan Note (Addendum)
Infrequently, mostly hormonal during some months of menses. Using OTC treatment or sleep with resolve. No recent use of Sumatriptan. Continue to monitor.

## 2021-05-24 DIAGNOSIS — F411 Generalized anxiety disorder: Secondary | ICD-10-CM

## 2021-05-24 NOTE — Telephone Encounter (Signed)
Ok to resend refill?

## 2021-05-25 MED ORDER — SERTRALINE HCL 100 MG PO TABS: 100.0000 mg | ORAL_TABLET | Freq: Every day | ORAL | 2 refills | Status: DC

## 2022-04-12 ENCOUNTER — Other Ambulatory Visit: Payer: Self-pay | Admitting: Primary Care

## 2022-04-12 DIAGNOSIS — F411 Generalized anxiety disorder: Secondary | ICD-10-CM

## 2022-04-26 ENCOUNTER — Other Ambulatory Visit: Payer: Self-pay | Admitting: Primary Care

## 2022-04-26 DIAGNOSIS — F411 Generalized anxiety disorder: Secondary | ICD-10-CM

## 2022-05-07 ENCOUNTER — Other Ambulatory Visit: Payer: Self-pay | Admitting: Primary Care

## 2022-05-07 DIAGNOSIS — F411 Generalized anxiety disorder: Secondary | ICD-10-CM

## 2022-07-21 ENCOUNTER — Other Ambulatory Visit: Payer: Self-pay | Admitting: Primary Care

## 2022-07-21 DIAGNOSIS — F411 Generalized anxiety disorder: Secondary | ICD-10-CM

## 2022-07-25 ENCOUNTER — Other Ambulatory Visit: Payer: Self-pay | Admitting: Primary Care

## 2022-07-25 DIAGNOSIS — F411 Generalized anxiety disorder: Secondary | ICD-10-CM

## 2022-09-29 ENCOUNTER — Telehealth (INDEPENDENT_AMBULATORY_CARE_PROVIDER_SITE_OTHER): Payer: BC Managed Care – PPO | Admitting: Primary Care

## 2022-09-29 ENCOUNTER — Encounter: Payer: Self-pay | Admitting: Primary Care

## 2022-09-29 DIAGNOSIS — B001 Herpesviral vesicular dermatitis: Secondary | ICD-10-CM

## 2022-09-29 DIAGNOSIS — F411 Generalized anxiety disorder: Secondary | ICD-10-CM | POA: Diagnosis not present

## 2022-09-29 DIAGNOSIS — G43829 Menstrual migraine, not intractable, without status migrainosus: Secondary | ICD-10-CM | POA: Diagnosis not present

## 2022-09-29 MED ORDER — VALACYCLOVIR HCL 1 G PO TABS: ORAL_TABLET | ORAL | 0 refills | Status: DC

## 2022-09-29 MED ORDER — ESCITALOPRAM OXALATE 10 MG PO TABS: 10.0000 mg | ORAL_TABLET | Freq: Every day | ORAL | 0 refills | Status: DC

## 2022-09-29 NOTE — Patient Instructions (Signed)
Stop taking zoloft for anxiety. Start taking Lexapro for anxiety.   Please notify me if you would like to try daily Valtrex.   Update me in 1 month!  It was a pleasure to see you today!

## 2022-09-29 NOTE — Assessment & Plan Note (Signed)
Frequent outbreaks.  Offered to resume daily, preventative treatment, she declines. Continue Abreva.  She will update if she changes her mind.

## 2022-09-29 NOTE — Assessment & Plan Note (Signed)
Controlled!  Continue sumatriptan 50 mg PRN.

## 2022-09-29 NOTE — Progress Notes (Signed)
Patient ID: JOVANNI ECKHART, female    DOB: 03-Sep-1986, 36 y.o.   MRN: [id number]  Virtual visit completed through [place], a video enabled telemedicine application. Due to national recommendations of social distancing due to COVID-19, a virtual visit is felt to be most appropriate for this patient at this time. Reviewed limitations, risks, security and privacy concerns of performing a virtual visit and the availability of in person appointments. I also reviewed that there may be a patient responsible charge related to this service. The patient agreed to proceed.   Patient location: car Provider location: [place] at [facility], office Persons participating in this virtual visit: patient, provider   If any vitals were documented, they were collected by patient at home unless specified below.    There were no vitals taken for this visit.   CC: Anxiety Subjective:   HPI: Sheri Ruiz is a 36 y.o. female with a history of migraines, GAD, herpes labialis presenting on 09/29/2022 for Medication Management (Discuss anxiety meds)  1) GAD: Currently managed on sertraline 100 mg daily and hydroxyzine 25 mg PRN. She's been breaking her 100 mg tablets in half to take 50 mg daily for the last 3 months. She ran out of her sertraline one week ago, has been taking her husbands 50 mg dose of sertraline as he takes the same medication.   She doesn't feel that sertraline is as effective, questions if the effects aren't as strong as she's been taking for so long. Symptoms include feeling "scattered", difficulty focusing, feeling overwhelmed/stressed at times.   2) Herpes Labialis: Currently managed on Valtrex 1000 mg PRN. She's experienced breakouts once monthly, sometimes twice monthly. Typically associated with stressful events. She's been taking Abreva OTC which helps if she catches the breakout early enough.   She was once managed on daily, continuous treatment for 3 months, this helped to  prevent outbreaks but as soon as she stopped her outbreaks returned.   3) Migraines: Currently managed on sumatriptan 50 mg PRN. Overall migraines are infrequent, last migraine >6 months ago. No recent use of sumatriptan.       Relevant past medical, surgical, family and social history reviewed and updated as indicated. Interim medical history since our last visit reviewed. Allergies and medications reviewed and updated. Outpatient Medications Prior to Visit  Medication Sig Dispense Refill   sertraline (ZOLOFT) 100 MG tablet Take 1 tablet (100 mg total) by mouth daily. For anxiety. Office visit required for further refills. 30 tablet 0   valACYclovir (VALTREX) 1000 MG tablet Take 2 tablets twice daily for one day as needed for outbreaks. 12 tablet 0   hydrOXYzine (ATARAX/VISTARIL) 25 MG tablet Take 1 tablet (25 mg total) by mouth 2 (two) times daily as needed for anxiety. (Patient not taking: Reported on 09/29/2022) 30 tablet 0   SUMAtriptan (IMITREX) 50 MG tablet Take 1 tablet by mouth at migraine onset. May repeat in 2 hours if headache persists or recurs. (Patient not taking: Reported on 02/25/2021) 10 tablet 0   No facility-administered medications prior to visit.     Per HPI unless specifically indicated in ROS section below Review of Systems  Skin:        Herpes labialis   Neurological:  Negative for headaches.  Psychiatric/Behavioral:  Positive for decreased concentration. The patient is nervous/anxious.        See HPI   Objective:  There were no vitals taken for this visit.  Wt Readings from Last 3 Encounters:  02/25/21 196 lb 8 oz (89.1 kg)  04/24/20 190 lb (86.2 kg)  04/24/20 190 lb (86.2 kg)       Physical exam: General: Alert and oriented x 3, no distress, does not appear sickly  Pulmonary: Speaks in complete sentences without increased work of breathing, no cough during visit.  Psychiatric: Normal mood, thought content, and behavior.     Results for orders  placed or performed in visit on 10/30/19  Novel Coronavirus, NAA ([facility])   Specimen: Nasopharyngeal(NP) swabs in vial transport medium   NASOPHARYNGE  TESTING  Result Value Ref Range   SARS-CoV-2, NAA Not Detected Not Detected   Assessment & Plan:   Problem List Items Addressed This Visit       Cardiovascular and Mediastinum   Migraines    Controlled!  Continue sumatriptan 50 mg PRN.       Relevant Medications   escitalopram (LEXAPRO) 10 MG tablet     Digestive   Herpes labialis    Frequent outbreaks.  Offered to resume daily, preventative treatment, she declines. Continue Abreva.  She will update if she changes her mind.      Relevant Medications   valACYclovir (VALTREX) 1000 MG tablet     Other   GAD (generalized anxiety disorder) - Primary    Deteriorated.  Discontinue Zoloft 50 mg. Start Lexapro 10 mg.  Update in 1 month.      Relevant Medications   escitalopram (LEXAPRO) 10 MG tablet     Meds ordered this encounter  Medications   escitalopram (LEXAPRO) 10 MG tablet    Sig: Take 1 tablet (10 mg total) by mouth daily. For anxiety    Dispense:  90 tablet    Refill:  0    Order Specific Question:   Supervising Provider    Answer:   BEDSOLE, AMY E [[id number]]   valACYclovir (VALTREX) 1000 MG tablet    Sig: Take 2 tablets twice daily for one day as needed for outbreaks.    Dispense:  12 tablet    Refill:  0    Order Specific Question:   Supervising Provider    Answer:   BEDSOLE, AMY E [[id number]]   No orders of the defined types were placed in this encounter.   I discussed the assessment and treatment plan with the patient. The patient was provided an opportunity to ask questions and all were answered. The patient agreed with the plan and demonstrated an understanding of the instructions. The patient was advised to call back or seek an in-person evaluation if the symptoms worsen or if the condition fails to improve as anticipated.  Follow up plan:  Stop  taking zoloft for anxiety. Start taking Lexapro for anxiety.   Please notify me if you would like to try daily Valtrex.   Update me in 1 month!  It was a pleasure to see you today!   [name], NP

## 2022-09-29 NOTE — Assessment & Plan Note (Signed)
Deteriorated.  Discontinue Zoloft 50 mg. Start Lexapro 10 mg.  Update in 1 month.

## 2022-11-11 DIAGNOSIS — Z124 Encounter for screening for malignant neoplasm of cervix: Secondary | ICD-10-CM | POA: Diagnosis not present

## 2022-11-11 DIAGNOSIS — Z1151 Encounter for screening for human papillomavirus (HPV): Secondary | ICD-10-CM | POA: Diagnosis not present

## 2022-11-11 DIAGNOSIS — D229 Melanocytic nevi, unspecified: Secondary | ICD-10-CM | POA: Diagnosis not present

## 2022-11-11 DIAGNOSIS — Z1331 Encounter for screening for depression: Secondary | ICD-10-CM | POA: Diagnosis not present

## 2022-11-11 DIAGNOSIS — Z01411 Encounter for gynecological examination (general) (routine) with abnormal findings: Secondary | ICD-10-CM | POA: Diagnosis not present

## 2022-12-20 ENCOUNTER — Other Ambulatory Visit: Payer: Self-pay | Admitting: Primary Care

## 2022-12-20 DIAGNOSIS — F411 Generalized anxiety disorder: Secondary | ICD-10-CM

## 2023-04-04 ENCOUNTER — Other Ambulatory Visit: Payer: Self-pay | Admitting: Primary Care

## 2023-04-04 DIAGNOSIS — F411 Generalized anxiety disorder: Secondary | ICD-10-CM

## 2023-07-08 ENCOUNTER — Other Ambulatory Visit: Payer: Self-pay | Admitting: Primary Care

## 2023-07-08 DIAGNOSIS — F411 Generalized anxiety disorder: Secondary | ICD-10-CM

## 2023-07-09 NOTE — Telephone Encounter (Signed)
Patient is due for follow up, this will be required prior to any further refills. This must be an IN PERSON visit.  Please schedule, thank you!

## 2023-07-10 NOTE — Telephone Encounter (Signed)
Spoke to pt, scheduled f/u for 07/13/23

## 2023-07-10 NOTE — Telephone Encounter (Signed)
Lvmtcb, sent mychart message  

## 2023-07-13 ENCOUNTER — Ambulatory Visit: Payer: BC Managed Care – PPO | Admitting: Primary Care

## 2023-07-21 ENCOUNTER — Ambulatory Visit: Payer: BC Managed Care – PPO | Admitting: Primary Care

## 2023-07-21 VITALS — BP 108/70 | HR 77 | Temp 98.6°F | Ht 63.25 in | Wt 177.8 lb

## 2023-07-21 DIAGNOSIS — B001 Herpesviral vesicular dermatitis: Secondary | ICD-10-CM

## 2023-07-21 DIAGNOSIS — Z23 Encounter for immunization: Secondary | ICD-10-CM | POA: Diagnosis not present

## 2023-07-21 DIAGNOSIS — F411 Generalized anxiety disorder: Secondary | ICD-10-CM | POA: Diagnosis not present

## 2023-07-21 DIAGNOSIS — G43009 Migraine without aura, not intractable, without status migrainosus: Secondary | ICD-10-CM

## 2023-07-21 DIAGNOSIS — Z0001 Encounter for general adult medical examination with abnormal findings: Secondary | ICD-10-CM | POA: Diagnosis not present

## 2023-07-21 DIAGNOSIS — J3489 Other specified disorders of nose and nasal sinuses: Secondary | ICD-10-CM

## 2023-07-21 DIAGNOSIS — G43821 Menstrual migraine, not intractable, with status migrainosus: Secondary | ICD-10-CM | POA: Diagnosis not present

## 2023-07-21 MED ORDER — MUPIROCIN 2 % EX OINT: 1.0000 | TOPICAL_OINTMENT | Freq: Two times a day (BID) | CUTANEOUS | 0 refills | Status: DC | PRN

## 2023-07-21 MED ORDER — VALACYCLOVIR HCL 1 G PO TABS: ORAL_TABLET | ORAL | 0 refills | Status: AC

## 2023-07-21 MED ORDER — SUMATRIPTAN SUCCINATE 50 MG PO TABS: ORAL_TABLET | ORAL | 0 refills | Status: DC

## 2023-07-21 NOTE — Assessment & Plan Note (Addendum)
Overall controlled.  Continue Lexapro 10 mg daily. Referral placed for therapy

## 2023-07-21 NOTE — Assessment & Plan Note (Signed)
Controlled. Infrequent.  Continue sumatriptan 50 mg PRN. Refill provided.

## 2023-07-21 NOTE — Patient Instructions (Addendum)
You may apply the nasal ointment twice daily as needed for the nasal sores.  I sent refills to the pharmacy for Imitrex and Valtrex.  You will either be contacted via phone regarding your referral to therapy, or you may receive a letter on your MyChart portal from our referral team with instructions for scheduling an appointment. Please let us know if you have not been contacted by anyone within two weeks.  It was a pleasure to see you today!

## 2023-07-21 NOTE — Assessment & Plan Note (Signed)
Start mupirocin 2% ointment nasally twice daily as needed.

## 2023-07-21 NOTE — Assessment & Plan Note (Signed)
Infrequent outbreaks.  Continue Valtrex 2000 mg BID x 1 day PRN. Continue Abreva PRN

## 2023-07-21 NOTE — Addendum Note (Signed)
Addended by: Aundra Millet on: 07/21/2023 01:04 PM   Modules accepted: Orders

## 2023-07-21 NOTE — Assessment & Plan Note (Signed)
Tetanus due, provided today. Pap smear UTD.  Discussed the importance of a healthy diet and regular exercise in order for weight loss, and to reduce the risk of further co-morbidity.  Exam stable. Labs pending.  Follow up in 1 year for repeat physical.

## 2023-07-21 NOTE — Progress Notes (Signed)
Subjective:    Patient ID: Sheri Ruiz, female    DOB: [birth year], 37 y.o.   MRN: [id number]  Medication Refill Pertinent negatives include no arthralgias, chest pain, coughing, headaches, myalgias, numbness or rash.    Sheri Ruiz is a very pleasant 37 y.o. female who presents today for complete physical and follow up of chronic conditions.  Over the last year she's noticed recurrent sores to the inside of her nose which are painful. She denies bleeding. The sores will get crusty. She's applied Abreva intermittently without much improvement.   She would like to meet with a therapist given personal stressors. Overall feels well managed on Lexapro, but would like meet with someone.  Immunizations: -Tetanus: Completed in 2013  Diet: Fair diet.  Exercise: No regular exercise.  Eye exam: Completed years ago Dental exam: Completed about 1 year ago  Pap Smear: Follows with GYN   BP Readings from Last 3 Encounters:  07/21/23 108/70  02/25/21 108/68  04/24/20 111/75      Review of Systems  Constitutional:  Negative for unexpected weight change.  HENT:  Negative for rhinorrhea.   Respiratory:  Negative for cough and shortness of breath.   Cardiovascular:  Negative for chest pain.  Gastrointestinal:  Negative for constipation and diarrhea.  Genitourinary:  Negative for difficulty urinating and menstrual problem.  Musculoskeletal:  Negative for arthralgias and myalgias.  Skin:  Negative for rash.  Allergic/Immunologic: Negative for environmental allergies.  Neurological:  Negative for dizziness, numbness and headaches.  Psychiatric/Behavioral:  The patient is nervous/anxious.          Past Medical History:  Diagnosis Date   Abnormal Pap smear of cervix    Depression    Hypokalemia    Hypomagnesemia    LGSIL (low grade squamous intraepithelial dysplasia)    Migraine    Oral herpes    Preterm delivery     Social History   Socioeconomic History    Marital status: Married    Spouse name: Not on file   Number of children: Not on file   Years of education: Not on file   Highest education level: Associate degree: occupational, Scientist, product/process development, or vocational program  Occupational History   Not on file  Tobacco Use   Smoking status: Never   Smokeless tobacco: Never  Substance and Sexual Activity   Alcohol use: No    Alcohol/week: 0.0 standard drinks of alcohol   Drug use: No   Sexual activity: Yes    Birth control/protection: None  Other Topics Concern   Not on file  Social History Narrative   Married.   2 children.   Works as a Social worker.   Enjoys spending time with family.    Social Determinants of Health   Financial Resource Strain: Low Risk  (07/12/2023)   Overall Financial Resource Strain (CARDIA)    Difficulty of Paying Living Expenses: Not very hard  Food Insecurity: No Food Insecurity (07/12/2023)   Hunger Vital Sign    Worried About Running Out of Food in the Last Year: Never true    Ran Out of Food in the Last Year: Never true  Transportation Needs: No Transportation Needs (07/12/2023)   PRAPARE - Administrator, Civil Service (Medical): No    Lack of Transportation (Non-Medical): No  Physical Activity: Unknown (07/12/2023)   Exercise Vital Sign    Days of Exercise per Week: 0 days    Minutes of Exercise per Session: Not on file  Stress: Stress Concern Present (07/12/2023)   [employer] of Occupational Health - Occupational Stress Questionnaire    Feeling of Stress : Very much  Social Connections: Socially Integrated (07/12/2023)   Social Connection and Isolation Panel [NHANES]    Frequency of Communication with Friends and Family: More than three times a week    Frequency of Social Gatherings with Friends and Family: Once a week    Attends Religious Services: More than 4 times per year    Active Member of [employer] or Organizations: Yes    Attends Banker Meetings: 1 to 4 times per year     Marital Status: Married  Catering manager Violence: Not on file    Past Surgical History:  Procedure Laterality Date   COLPOSCOPY     TONSILLECTOMY      Family History  Problem Relation Age of Onset   Transient ischemic attack Mother    Hypertension Maternal Grandmother    Clotting disorder Maternal Grandmother     Allergies  Allergen Reactions   Amoxicillin Rash and Nausea And Vomiting    Other reaction(s): GI Intolerance, Nausea And Vomiting    Current Outpatient Medications on File Prior to Visit  Medication Sig Dispense Refill   escitalopram (LEXAPRO) 10 MG tablet TAKE 1 TABLET (10 MG TOTAL) BY MOUTH DAILY. FOR ANXIETY 90 tablet 0   No current facility-administered medications on file prior to visit.    BP 108/70 (BP Location: Left Arm, Patient Position: Sitting, Cuff Size: Normal)   Pulse 77   Temp 98.6 F (37 C) (Oral)   Ht 5' 3.25" (1.607 m)   Wt 177 lb 12.8 oz (80.6 kg)   SpO2 99%   BMI 31.25 kg/m  Objective:   Physical Exam HENT:     Right Ear: Tympanic membrane and ear canal normal.     Left Ear: Tympanic membrane and ear canal normal.     Nose: Nose normal.  Eyes:     Conjunctiva/sclera: Conjunctivae normal.     Pupils: Pupils are equal, round, and reactive to light.  Neck:     Thyroid: No thyromegaly.  Cardiovascular:     Rate and Rhythm: Normal rate and regular rhythm.     Heart sounds: No murmur heard. Pulmonary:     Effort: Pulmonary effort is normal.     Breath sounds: Normal breath sounds. No rales.  Abdominal:     General: Bowel sounds are normal.     Palpations: Abdomen is soft.     Tenderness: There is no abdominal tenderness.  Musculoskeletal:        General: Normal range of motion.     Cervical back: Neck supple.  Lymphadenopathy:     Cervical: No cervical adenopathy.  Skin:    General: Skin is warm and dry.     Findings: No rash.  Neurological:     Mental Status: She is alert and oriented to person, place, and time.      Cranial Nerves: No cranial nerve deficit.     Deep Tendon Reflexes: Reflexes are normal and symmetric.  Psychiatric:        Mood and Affect: Mood normal.           Assessment & Plan:  Encounter for annual general medical examination with abnormal findings in adult Assessment & Plan: Tetanus due, provided today. Pap smear UTD.  Discussed the importance of a healthy diet and regular exercise in order for weight loss, and to reduce the risk of further  co-morbidity.  Exam stable. Labs pending.  Follow up in 1 year for repeat physical.    Migraine without aura and without status migrainosus, not intractable Assessment & Plan: Controlled. Infrequent.  Continue sumatriptan 50 mg PRN. Refill provided.    Menstrual migraine with status migrainosus, not intractable Assessment & Plan: Controlled. Infrequent.  Continue sumatriptan 50 mg PRN. Refill provided.   Orders: -     SUMAtriptan Succinate; Take 1 tablet by mouth at migraine onset. May repeat in 2 hours if headache persists or recurs.  Dispense: 10 tablet; Refill: 0  GAD (generalized anxiety disorder) Assessment & Plan: Overall controlled.  Continue Lexapro 10 mg daily. Referral placed for therapy  Orders: -     Ambulatory referral to Psychology  Herpes labialis Assessment & Plan: Infrequent outbreaks.  Continue Valtrex 2000 mg BID x 1 day PRN. Continue Abreva PRN  Orders: -     valACYclovir HCl; Take 2 tablets twice daily for one day as needed for outbreaks.  Dispense: 12 tablet; Refill: 0  Nasal sore Assessment & Plan: Start mupirocin 2% ointment nasally twice daily as needed.  Orders: -     Mupirocin; Place 1 Application into the nose 2 (two) times daily as needed.  Dispense: 22 g; Refill: 0        Doreene Nest, NP

## 2023-08-18 ENCOUNTER — Other Ambulatory Visit: Payer: Self-pay | Admitting: Primary Care

## 2023-08-18 DIAGNOSIS — G43821 Menstrual migraine, not intractable, with status migrainosus: Secondary | ICD-10-CM

## 2023-08-29 ENCOUNTER — Ambulatory Visit (INDEPENDENT_AMBULATORY_CARE_PROVIDER_SITE_OTHER): Payer: BC Managed Care – PPO | Admitting: Clinical

## 2023-08-29 DIAGNOSIS — F332 Major depressive disorder, recurrent severe without psychotic features: Secondary | ICD-10-CM | POA: Diagnosis not present

## 2023-08-29 DIAGNOSIS — F411 Generalized anxiety disorder: Secondary | ICD-10-CM | POA: Diagnosis not present

## 2023-08-29 NOTE — Progress Notes (Signed)
                Dezi Schaner, LCSW 
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
## 2023-09-28 ENCOUNTER — Ambulatory Visit: Payer: BC Managed Care – PPO | Admitting: Clinical

## 2023-10-09 ENCOUNTER — Other Ambulatory Visit: Payer: Self-pay | Admitting: Primary Care

## 2023-10-09 DIAGNOSIS — F411 Generalized anxiety disorder: Secondary | ICD-10-CM

## 2023-11-15 ENCOUNTER — Ambulatory Visit: Payer: BC Managed Care – PPO | Admitting: Dermatology

## 2023-11-15 DIAGNOSIS — L0100 Impetigo, unspecified: Secondary | ICD-10-CM

## 2023-11-15 DIAGNOSIS — Z8619 Personal history of other infectious and parasitic diseases: Secondary | ICD-10-CM

## 2023-11-15 DIAGNOSIS — D225 Melanocytic nevi of trunk: Secondary | ICD-10-CM | POA: Diagnosis not present

## 2023-11-15 DIAGNOSIS — L578 Other skin changes due to chronic exposure to nonionizing radiation: Secondary | ICD-10-CM

## 2023-11-15 DIAGNOSIS — D1801 Hemangioma of skin and subcutaneous tissue: Secondary | ICD-10-CM

## 2023-11-15 DIAGNOSIS — Z79899 Other long term (current) drug therapy: Secondary | ICD-10-CM

## 2023-11-15 DIAGNOSIS — Z7189 Other specified counseling: Secondary | ICD-10-CM

## 2023-11-15 DIAGNOSIS — D229 Melanocytic nevi, unspecified: Secondary | ICD-10-CM

## 2023-11-15 DIAGNOSIS — J3489 Other specified disorders of nose and nasal sinuses: Secondary | ICD-10-CM

## 2023-11-15 DIAGNOSIS — W908XXA Exposure to other nonionizing radiation, initial encounter: Secondary | ICD-10-CM

## 2023-11-15 DIAGNOSIS — Z1283 Encounter for screening for malignant neoplasm of skin: Secondary | ICD-10-CM | POA: Diagnosis not present

## 2023-11-15 DIAGNOSIS — D485 Neoplasm of uncertain behavior of skin: Secondary | ICD-10-CM

## 2023-11-15 DIAGNOSIS — L732 Hidradenitis suppurativa: Secondary | ICD-10-CM

## 2023-11-15 DIAGNOSIS — D239 Other benign neoplasm of skin, unspecified: Secondary | ICD-10-CM

## 2023-11-15 DIAGNOSIS — D492 Neoplasm of unspecified behavior of bone, soft tissue, and skin: Secondary | ICD-10-CM

## 2023-11-15 DIAGNOSIS — L814 Other melanin hyperpigmentation: Secondary | ICD-10-CM

## 2023-11-15 DIAGNOSIS — L821 Other seborrheic keratosis: Secondary | ICD-10-CM

## 2023-11-15 HISTORY — DX: Other benign neoplasm of skin, unspecified: D23.9

## 2023-11-15 MED ORDER — MUPIROCIN 2 % EX OINT: TOPICAL_OINTMENT | CUTANEOUS | 2 refills | Status: AC

## 2023-11-15 MED ORDER — CLINDAMYCIN PHOSPHATE 1 % EX LOTN: TOPICAL_LOTION | Freq: Every day | CUTANEOUS | 6 refills | Status: AC

## 2023-11-15 NOTE — Patient Instructions (Addendum)
Wound Care Instructions  Cleanse wound gently with soap and water once a day then pat dry with clean gauze. Apply a thin coat of Petrolatum (petroleum jelly, "Vaseline") over the wound (unless you have an allergy to this). We recommend that you use a new, sterile tube of Vaseline. Do not pick or remove scabs. Do not remove the yellow or white "healing tissue" from the base of the wound.  Cover the wound with fresh, clean, nonstick gauze and secure with paper tape. You may use Band-Aids in place of gauze and tape if the wound is small enough, but would recommend trimming much of the tape off as there is often too much. Sometimes Band-Aids can irritate the skin.  You should call the office for your biopsy report after 1 week if you have not already been contacted.  If you experience any problems, such as abnormal amounts of bleeding, swelling, significant bruising, significant pain, or evidence of infection, please call the office immediately.  FOR ADULT SURGERY PATIENTS: If you need something for pain relief you may take 1 extra strength Tylenol (acetaminophen) AND 2 Ibuprofen (200mg  each) together every 4 hours as needed for pain. (do not take these if you are allergic to them or if you have a reason you should not take them.) Typically, you may only need pain medication for 1 to 3 days.   Start Mupirocin 2% ointment to affected areas of sores on the face TID, and apply inside the nose TID. After two weeks decrease Mupirocin 2% ointment to inside the nose on the weekends only.   Start Clindamycin solution to bumps in the groin area once daily.   Due to recent changes in healthcare laws, you may see results of your pathology and/or laboratory studies on MyChart before the doctors have had a chance to review them. We understand that in some cases there may be results that are confusing or concerning to you. Please understand that not all results are received at the same time and often the doctors may  need to interpret multiple results in order to provide you with the best plan of care or course of treatment. Therefore, we ask that you please give [place] 2 business days to thoroughly review all your results before contacting the office for clarification. Should we see a critical lab result, you will be contacted sooner.   If You Need Anything After Your Visit  If you have any questions or concerns for your doctor, please call our main line at [phone] and press option 4 to reach your doctor's medical assistant. If no one answers, please leave a voicemail as directed and we will return your call as soon as possible. Messages left after 4 pm will be answered the following business day.   You may also send [place] a message via MyChart. We typically respond to MyChart messages within 1-2 business days.  For prescription refills, please ask your pharmacy to contact our office. Our fax number is [phone].  If you have an urgent issue when the clinic is closed that cannot wait until the next business day, you can page your doctor at the number below.    Please note that while we do our best to be available for urgent issues outside of office hours, we are not available 24/7.   If you have an urgent issue and are unable to reach [place], you may choose to seek medical care at your doctor's office, retail clinic, urgent care center, or emergency room.  If you have a medical emergency, please immediately call 911 or go to the emergency department.  Pager Numbers  - Dr. Gwen Pounds: [phone]  - Dr. Roseanne Reno: [phone]  - Dr. Katrinka Blazing: [phone]   In the event of inclement weather, please call our main line at [phone] for an update on the status of any delays or closures.  Dermatology Medication Tips: Please keep the boxes that topical medications come in in order to help keep track of the instructions about where and how to use these. Pharmacies typically print the medication instructions  only on the boxes and not directly on the medication tubes.   If your medication is too expensive, please contact our office at [phone] option 4 or send [place] a message through MyChart.   We are unable to tell what your co-pay for medications will be in advance as this is different depending on your insurance coverage. However, we may be able to find a substitute medication at lower cost or fill out paperwork to get insurance to cover a needed medication.   If a prior authorization is required to get your medication covered by your insurance company, please allow [place] 1-2 business days to complete this process.  Drug prices often vary depending on where the prescription is filled and some pharmacies may offer cheaper prices.  The website www.goodrx.com contains coupons for medications through different pharmacies. The prices here do not account for what the cost may be with help from insurance (it may be cheaper with your insurance), but the website can give you the price if you did not use any insurance.  - You can print the associated coupon and take it with your prescription to the pharmacy.  - You may also stop by our office during regular business hours and pick up a GoodRx coupon card.  - If you need your prescription sent electronically to a different pharmacy, notify our office through [facility] or by phone at [phone] option 4.     Si Usted Necesita Algo Despus de Su Visita  Tambin puede enviarnos un mensaje a travs de Clinical cytogeneticist. Por lo general respondemos a los mensajes de MyChart en el transcurso de 1 a 2 das hbiles.  Para renovar recetas, por favor pida a su farmacia que se ponga en contacto con nuestra oficina. Annie Sable de fax es [place] [phone].  Si tiene un asunto urgente cuando la clnica est cerrada y que no puede esperar hasta el siguiente da hbil, puede llamar/localizar a su doctor(a) al nmero que aparece a continuacin.   Por favor, tenga en  cuenta que aunque hacemos todo lo posible para estar disponibles para asuntos urgentes fuera del horario de [place], no estamos disponibles las 24 horas del da, los 7 [address]  [address] de la [place].   Si tiene un problema urgente y no puede comunicarse con nosotros, puede optar por buscar atencin mdica  en el consultorio de su doctor(a), en una clnica privada, en un centro de atencin urgente o en una sala de emergencias.  Si tiene Engineer, drilling, por favor llame inmediatamente al 911 o vaya a la sala de emergencias.  Nmeros de bper  - Dr. Gwen Pounds: [phone]  - Dra. Roseanne Reno: [phone]  - Dr. Katrinka Blazing: [phone]   En caso de inclemencias del tiempo, por favor llame a Lacy Duverney principal al [phone] para una actualizacin sobre el [place] de cualquier retraso o cierre.  Consejos para la medicacin en dermatologa: Por favor, guarde las cajas en las que vienen los  medicamentos de uso tpico para ayudarle a seguir las [employer] dnde y cmo usarlos. Las farmacias generalmente imprimen las instrucciones del medicamento slo en las cajas y no directamente en los tubos del [place].   Si su medicamento es muy caro, por favor, pngase en contacto con Rolm Gala llamando al [phone] y presione la opcin 4 o envenos un mensaje a travs de Clinical cytogeneticist.   No podemos decirle cul ser su copago por los medicamentos por adelantado ya que esto es diferente dependiendo de la cobertura de su seguro. Sin embargo, es posible que podamos encontrar un medicamento sustituto a Audiological scientist un formulario para que el seguro cubra el medicamento que se considera necesario.   Si se requiere una autorizacin previa para que su compaa de seguros [place] su medicamento, por favor permtanos de 1 a 2 das hbiles para completar [address]  Los precios de los medicamentos varan con frecuencia dependiendo del Environmental consultant de dnde se surte la receta y alguna farmacias pueden ofrecer  precios ms baratos.  El sitio web www.goodrx.com tiene cupones para medicamentos de Health and safety inspector. Los precios aqu no tienen en cuenta lo que podra costar con la ayuda del seguro (puede ser ms barato con su seguro), pero el sitio web puede darle el precio si no utiliz Tourist information centre manager.  - Puede imprimir el cupn correspondiente y llevarlo con su receta a la farmacia.  - Tambin puede pasar por nuestra oficina durante el horario de atencin regular y Education officer, museum una tarjeta de cupones de GoodRx.  - Si necesita que su receta se enve electrnicamente a una farmacia diferente, informe a nuestra oficina a travs de MyChart de [place] o por telfono llamando al [phone] y presione la opcin 4.

## 2023-11-15 NOTE — Progress Notes (Signed)
New Patient Visit   Subjective  Sheri Ruiz is a 37 y.o. female who presents for the following: Skin Cancer Screening and Full Body Skin Exam  The patient presents for Total-Body Skin Exam (TBSE) for skin cancer screening and mole check. The patient has spots, moles and lesions to be evaluated, some may be new or changing and the patient may have concern these could be cancer.    The following portions of the chart were reviewed this encounter and updated as appropriate: medications, allergies, medical history  Review of Systems:  No other skin or systemic complaints except as noted in HPI or Assessment and Plan.  Objective  Well appearing patient in no apparent distress; mood and affect are within normal limits.  A full examination was performed including scalp, head, eyes, ears, nose, lips, neck, chest, axillae, abdomen, back, buttocks, bilateral upper extremities, bilateral lower extremities, hands, feet, fingers, toes, fingernails, and toenails. All findings within normal limits unless otherwise noted below.   Relevant physical exam findings are noted in the Assessment and Plan.  Perioral Perinasal sores. L mid to low back paraspinal 0.6 cm irregular brown macule.    Assessment & Plan   SKIN CANCER SCREENING PERFORMED TODAY.  ACTINIC DAMAGE - Chronic condition, secondary to cumulative UV/sun exposure - diffuse scaly erythematous macules with underlying dyspigmentation - Recommend daily broad spectrum sunscreen SPF 30+ to sun-exposed areas, reapply every 2 hours as needed.  - Staying in the shade or wearing long sleeves, sun glasses (UVA+UVB protection) and wide brim hats (4-inch brim around the entire circumference of the hat) are also recommended for sun protection.  - Call for new or changing lesions.  LENTIGINES, SEBORRHEIC KERATOSES, HEMANGIOMAS - Benign normal skin lesions - Benign-appearing - Call for any changes  MELANOCYTIC NEVI - Tan-brown and/or  pink-flesh-colored symmetric macules and papules - Benign appearing on exam today - Observation - Call clinic for new or changing moles - Recommend daily use of broad spectrum spf 30+ sunscreen to sun-exposed areas.   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 Condition is symptomatic/ bothersome to patient. Not currently at goal.  Herpes Simplex Virus = Cold Sores = Fever Blisters is a chronic recurring blistering; scabbing sore-producing viral infection that is recurrent usually in the same area triggered by stress, sun/UV exposure and trauma.  It is infectious and can be spread from person to person by direct contact.  It is not curable, but is treatable with topical and oral medication.  Treatment Plan Continue Valacyclovir 2 grams every 12 hours for 2 doses with a glass of water at the first sign of symptoms.  HIDRADENITIS SUPPURATIVA - mild Exam: papules of the groin  Chronic and persistent condition with duration or expected duration over one year. Condition is symptomatic/ bothersome to patient. Not currently at goal.  Hidradenitis Suppurativa is a chronic; persistent; non-curable, but treatable condition due to abnormal inflamed sweat glands in the body folds (axilla, inframammary, groin, medial thighs), causing recurrent painful draining cysts and scarring. It can be associated with severe scarring acne and cysts; also abscesses and scarring of scalp. The goal is control and prevention of flares, as it is not curable. Scars are permanent and can be thickened. Treatment may include daily use of topical medication and oral antibiotics.  Oral isotretinoin may also be helpful.  For some cases, Humira or Cosentyx (biologic injections) may be prescribed to decrease the inflammatory  process and prevent flares.  When indicated, inflamed cysts may also be treated surgically.  Treatment Plan: Start  Clindamycin solution QD.  IMPETIGO Perioral Previously treated by PCP with Mupirocin 2% ointment. Discussed infectious nature.  Condition may not be curable and she may have colonization on the skin especially in skin fold sites as well as in the nose.  Start Doxycycline 100 mg po BID x 10 days. D/C Abreva. Restart Mupirocin 2% ointment to aa's TID x 2 weeks. Restart Mupirocin 2% ointment TID inside the nose x 2 weeks. After two weeks continue Mupirocin 2% ointment inside the nose TID on the weekends.  NEOPLASM OF UNCERTAIN BEHAVIOR OF SKIN L mid to low back paraspinal Epidermal / dermal shaving  Lesion diameter (cm):  0.6 Informed consent: discussed and consent obtained   Timeout: patient name, date of birth, surgical site, and procedure verified   Procedure prep:  Patient was prepped and draped in usual sterile fashion Prep type:  Isopropyl alcohol Anesthesia: the lesion was anesthetized in a standard fashion   Anesthetic:  1% lidocaine w/ epinephrine 1-100,000 buffered w/ 8.4% NaHCO3 Instrument used: flexible razor blade   Hemostasis achieved with: pressure, aluminum chloride and electrodesiccation   Outcome: patient tolerated procedure well   Post-procedure details: sterile dressing applied and wound care instructions given   Dressing type: bandage and petrolatum   Specimen 1 - Surgical pathology Differential Diagnosis: D48.5 r/o dysplastic nevus Check Margins: Yes NASAL SORE   Related Medications mupirocin ointment (BACTROBAN) 2 % Apply to sores on the face and inside the nose TID x 2 weeks. After two weeks decrease use to inside the nose on the weekends only. ACTINIC SKIN DAMAGE   SKIN CANCER SCREENING   LENTIGO   MELANOCYTIC NEVUS, UNSPECIFIED LOCATION   HIDRADENITIS SUPPURATIVA   MEDICATION MANAGEMENT   COUNSELING AND COORDINATION OF CARE     Return for HS and impetigo follow up in 6-8 mths.  Maylene Roes, CMA, am acting as scribe for Armida Sans, MD .   Documentation: I have reviewed the above documentation for accuracy and completeness, and I agree with the above.  Armida Sans, MD

## 2023-11-19 ENCOUNTER — Encounter: Payer: Self-pay | Admitting: Dermatology

## 2023-11-20 LAB — SURGICAL PATHOLOGY

## 2023-11-21 ENCOUNTER — Telehealth: Payer: Self-pay

## 2023-11-21 NOTE — Telephone Encounter (Addendum)
  Tried calling patient regarding results. No answer. LM for patient to return call.    ----- Message from Armida Sans sent at 11/21/2023  9:34 AM EST ----- FINAL DIAGNOSIS        1. Skin, left mid to low back paraspinal :       DYSPLASTIC COMPOUND NEVUS WITH SEVERE ATYPIA, CLOSE TO MARGIN, SEE DESCRIPTION   Severe dysplastic Margins clear, but "close to margin" May need additional procedure - if recurrent Recheck next visit July 2025

## 2023-11-21 NOTE — Telephone Encounter (Signed)
Patient advised of bx results. Will call if any changes before July. aw

## 2024-04-18 DIAGNOSIS — M5412 Radiculopathy, cervical region: Secondary | ICD-10-CM | POA: Diagnosis not present

## 2024-04-18 DIAGNOSIS — M9901 Segmental and somatic dysfunction of cervical region: Secondary | ICD-10-CM | POA: Diagnosis not present

## 2024-04-18 DIAGNOSIS — G43001 Migraine without aura, not intractable, with status migrainosus: Secondary | ICD-10-CM | POA: Diagnosis not present

## 2024-04-22 DIAGNOSIS — M9902 Segmental and somatic dysfunction of thoracic region: Secondary | ICD-10-CM | POA: Diagnosis not present

## 2024-04-22 DIAGNOSIS — M9901 Segmental and somatic dysfunction of cervical region: Secondary | ICD-10-CM | POA: Diagnosis not present

## 2024-04-22 DIAGNOSIS — M546 Pain in thoracic spine: Secondary | ICD-10-CM | POA: Diagnosis not present

## 2024-04-22 DIAGNOSIS — M5412 Radiculopathy, cervical region: Secondary | ICD-10-CM | POA: Diagnosis not present

## 2024-04-25 DIAGNOSIS — M546 Pain in thoracic spine: Secondary | ICD-10-CM | POA: Diagnosis not present

## 2024-04-25 DIAGNOSIS — M9902 Segmental and somatic dysfunction of thoracic region: Secondary | ICD-10-CM | POA: Diagnosis not present

## 2024-04-25 DIAGNOSIS — M5412 Radiculopathy, cervical region: Secondary | ICD-10-CM | POA: Diagnosis not present

## 2024-04-25 DIAGNOSIS — M9901 Segmental and somatic dysfunction of cervical region: Secondary | ICD-10-CM | POA: Diagnosis not present

## 2024-04-30 DIAGNOSIS — M9901 Segmental and somatic dysfunction of cervical region: Secondary | ICD-10-CM | POA: Diagnosis not present

## 2024-04-30 DIAGNOSIS — M5412 Radiculopathy, cervical region: Secondary | ICD-10-CM | POA: Diagnosis not present

## 2024-04-30 DIAGNOSIS — M9902 Segmental and somatic dysfunction of thoracic region: Secondary | ICD-10-CM | POA: Diagnosis not present

## 2024-04-30 DIAGNOSIS — M546 Pain in thoracic spine: Secondary | ICD-10-CM | POA: Diagnosis not present

## 2024-05-02 DIAGNOSIS — M5412 Radiculopathy, cervical region: Secondary | ICD-10-CM | POA: Diagnosis not present

## 2024-05-02 DIAGNOSIS — M9901 Segmental and somatic dysfunction of cervical region: Secondary | ICD-10-CM | POA: Diagnosis not present

## 2024-05-02 DIAGNOSIS — M546 Pain in thoracic spine: Secondary | ICD-10-CM | POA: Diagnosis not present

## 2024-05-02 DIAGNOSIS — M9902 Segmental and somatic dysfunction of thoracic region: Secondary | ICD-10-CM | POA: Diagnosis not present

## 2024-05-03 DIAGNOSIS — M542 Cervicalgia: Secondary | ICD-10-CM | POA: Diagnosis not present

## 2024-05-06 DIAGNOSIS — M546 Pain in thoracic spine: Secondary | ICD-10-CM | POA: Diagnosis not present

## 2024-05-06 DIAGNOSIS — M9901 Segmental and somatic dysfunction of cervical region: Secondary | ICD-10-CM | POA: Diagnosis not present

## 2024-05-06 DIAGNOSIS — M5412 Radiculopathy, cervical region: Secondary | ICD-10-CM | POA: Diagnosis not present

## 2024-05-06 DIAGNOSIS — M9902 Segmental and somatic dysfunction of thoracic region: Secondary | ICD-10-CM | POA: Diagnosis not present

## 2024-05-08 DIAGNOSIS — M9902 Segmental and somatic dysfunction of thoracic region: Secondary | ICD-10-CM | POA: Diagnosis not present

## 2024-05-08 DIAGNOSIS — M546 Pain in thoracic spine: Secondary | ICD-10-CM | POA: Diagnosis not present

## 2024-05-08 DIAGNOSIS — M9901 Segmental and somatic dysfunction of cervical region: Secondary | ICD-10-CM | POA: Diagnosis not present

## 2024-05-08 DIAGNOSIS — M5412 Radiculopathy, cervical region: Secondary | ICD-10-CM | POA: Diagnosis not present

## 2024-05-13 DIAGNOSIS — M546 Pain in thoracic spine: Secondary | ICD-10-CM | POA: Diagnosis not present

## 2024-05-13 DIAGNOSIS — M9901 Segmental and somatic dysfunction of cervical region: Secondary | ICD-10-CM | POA: Diagnosis not present

## 2024-05-13 DIAGNOSIS — M9902 Segmental and somatic dysfunction of thoracic region: Secondary | ICD-10-CM | POA: Diagnosis not present

## 2024-05-13 DIAGNOSIS — M5412 Radiculopathy, cervical region: Secondary | ICD-10-CM | POA: Diagnosis not present

## 2024-05-15 DIAGNOSIS — M542 Cervicalgia: Secondary | ICD-10-CM | POA: Diagnosis not present

## 2024-05-17 DIAGNOSIS — M542 Cervicalgia: Secondary | ICD-10-CM | POA: Diagnosis not present

## 2024-06-12 ENCOUNTER — Ambulatory Visit: Payer: BC Managed Care – PPO | Admitting: Dermatology

## 2024-07-18 ENCOUNTER — Other Ambulatory Visit: Payer: Self-pay | Admitting: Primary Care

## 2024-07-18 DIAGNOSIS — F411 Generalized anxiety disorder: Secondary | ICD-10-CM

## 2024-07-18 NOTE — Telephone Encounter (Signed)
 Lvmtcb, sent mychart message

## 2024-07-18 NOTE — Telephone Encounter (Signed)
 Patient is due for CPE/follow up, this will be required prior to any further refills.  Please schedule, thank you!

## 2024-07-19 NOTE — Telephone Encounter (Signed)
 Spoke to pt, pt stated she'd cb to sch, she was visiting her grandmother, whose currently on hospice.

## 2024-07-29 ENCOUNTER — Telehealth: Payer: Self-pay | Admitting: Primary Care

## 2024-07-29 DIAGNOSIS — Z0000 Encounter for general adult medical examination without abnormal findings: Secondary | ICD-10-CM

## 2024-07-29 NOTE — Telephone Encounter (Signed)
 Copied from CRM [id number]. Topic: Clinical - Request for Lab/Test Order >> Jul 29, 2024 12:08 PM Sheri Ruiz wrote: Reason for CRM: Patient has a physical scheduled for September 11th and would like if basic blood work could be sent in prior to her appointment date.

## 2024-07-30 NOTE — Addendum Note (Signed)
 Addended by: Gean Larose K on: 07/30/2024 12:32 PM   Modules accepted: Orders

## 2024-07-30 NOTE — Telephone Encounter (Signed)
 Noted, lab orders placed. Needs lab only appt. Fasting at least 4 hours.

## 2024-08-12 ENCOUNTER — Other Ambulatory Visit (INDEPENDENT_AMBULATORY_CARE_PROVIDER_SITE_OTHER)

## 2024-08-12 DIAGNOSIS — Z0000 Encounter for general adult medical examination without abnormal findings: Secondary | ICD-10-CM | POA: Diagnosis not present

## 2024-08-12 LAB — CBC
HCT: 36.9 % (ref 36.0–46.0)
Hemoglobin: 12.5 g/dL (ref 12.0–15.0)
MCHC: 33.8 g/dL (ref 30.0–36.0)
MCV: 87.1 fl (ref 78.0–100.0)
Platelets: 243 K/uL (ref 150.0–400.0)
RBC: 4.24 Mil/uL (ref 3.87–5.11)
RDW: 14.7 % (ref 11.5–15.5)
WBC: 5.8 K/uL (ref 4.0–10.5)

## 2024-08-12 LAB — COMPREHENSIVE METABOLIC PANEL WITH GFR
ALT: 23 U/L (ref 0–35)
AST: 17 U/L (ref 0–37)
Albumin: 4.2 g/dL (ref 3.5–5.2)
Alkaline Phosphatase: 52 U/L (ref 39–117)
BUN: 11 mg/dL (ref 6–23)
CO2: 30 meq/L (ref 19–32)
Calcium: 8.6 mg/dL (ref 8.4–10.5)
Chloride: 104 meq/L (ref 96–112)
Creatinine, Ser: 0.63 mg/dL (ref 0.40–1.20)
GFR: 111.24 mL/min (ref >60.00)
Glucose, Bld: 84 mg/dL (ref 70–99)
Potassium: 3.7 meq/L (ref 3.5–5.1)
Sodium: 141 meq/L (ref 135–145)
Total Bilirubin: 0.4 mg/dL (ref 0.2–1.2)
Total Protein: 6.4 g/dL (ref 6.0–8.3)

## 2024-08-12 LAB — LIPID PANEL
Cholesterol: 164 mg/dL (ref 0–200)
HDL: 55 mg/dL (ref >39.00)
LDL Cholesterol: 99 mg/dL (ref 0–99)
NonHDL: 109.48
Total CHOL/HDL Ratio: 3
Triglycerides: 54 mg/dL (ref 0.0–149.0)
VLDL: 10.8 mg/dL (ref 0.0–40.0)

## 2024-08-13 ENCOUNTER — Ambulatory Visit: Payer: Self-pay | Admitting: Primary Care

## 2024-08-13 ENCOUNTER — Other Ambulatory Visit: Payer: Self-pay | Admitting: Primary Care

## 2024-08-13 DIAGNOSIS — F411 Generalized anxiety disorder: Secondary | ICD-10-CM

## 2024-08-15 ENCOUNTER — Ambulatory Visit (INDEPENDENT_AMBULATORY_CARE_PROVIDER_SITE_OTHER): Admitting: Primary Care

## 2024-08-15 ENCOUNTER — Encounter: Payer: Self-pay | Admitting: Primary Care

## 2024-08-15 VITALS — BP 110/64 | HR 72 | Temp 97.3°F | Ht 63.25 in | Wt 177.0 lb

## 2024-08-15 DIAGNOSIS — G43009 Migraine without aura, not intractable, without status migrainosus: Secondary | ICD-10-CM | POA: Diagnosis not present

## 2024-08-15 DIAGNOSIS — F411 Generalized anxiety disorder: Secondary | ICD-10-CM | POA: Diagnosis not present

## 2024-08-15 DIAGNOSIS — B001 Herpesviral vesicular dermatitis: Secondary | ICD-10-CM | POA: Diagnosis not present

## 2024-08-15 DIAGNOSIS — M542 Cervicalgia: Secondary | ICD-10-CM

## 2024-08-15 DIAGNOSIS — Z0000 Encounter for general adult medical examination without abnormal findings: Secondary | ICD-10-CM | POA: Diagnosis not present

## 2024-08-15 DIAGNOSIS — G8929 Other chronic pain: Secondary | ICD-10-CM | POA: Insufficient documentation

## 2024-08-15 DIAGNOSIS — Z1231 Encounter for screening mammogram for malignant neoplasm of breast: Secondary | ICD-10-CM

## 2024-08-15 DIAGNOSIS — G43821 Menstrual migraine, not intractable, with status migrainosus: Secondary | ICD-10-CM

## 2024-08-15 MED ORDER — SUMATRIPTAN SUCCINATE 50 MG PO TABS: ORAL_TABLET | ORAL | 0 refills | Status: AC

## 2024-08-15 NOTE — Assessment & Plan Note (Signed)
 Immunizations UTD. Declines influenza vaccine.  Pap smear UTD. Due December 2026 Mammogram due, orders placed.  Discussed the importance of a healthy diet and regular exercise in order for weight loss, and to reduce the risk of further co-morbidity.  Exam stable. Labs pending.  Follow up in 1 year for repeat physical.

## 2024-08-15 NOTE — Assessment & Plan Note (Signed)
 Controlled.  Continue Valtrexx 2000 mg BID x 1 day

## 2024-08-15 NOTE — Patient Instructions (Addendum)
 [facility] to schedule your [facility].   You will either be contacted via phone regarding your referral to therapy and orthopedics, or you may receive a letter on your MyChart portal from our referral team with instructions for scheduling an appointment. Please let us  know if you have not been contacted by anyone within two weeks.  It was a pleasure to see you today!

## 2024-08-15 NOTE — Assessment & Plan Note (Signed)
 Controlled.  Continue sumatriptan  50 mg PRN. Refill provided.

## 2024-08-15 NOTE — Assessment & Plan Note (Signed)
 Following with orthopedics.  New referral placed for second opinion per patient request. Continue tizanidine as needed.

## 2024-08-15 NOTE — Assessment & Plan Note (Signed)
 Controlled.  Continue Lexapro  10 mg daily. Referral placed for therapy.

## 2024-08-15 NOTE — Progress Notes (Signed)
 Subjective:    Patient ID: Sheri Ruiz, female    DOB: 1986-02-29, 38 y.o.   MRN: [id number]  Sheri Ruiz is a very pleasant 38 y.o. female who presents today for complete physical and follow up of chronic conditions.  Sheri Ruiz is also wanting a referral for therapy and orthopedics.  Sheri Ruiz has been under increased stress over the summer.  Sheri Ruiz is currently managed on Lexapro  10 mg daily which does help.  Sheri Ruiz is interested in seeing a therapist.    Currently following with [facility] for left cervical spine pain with radiculopathy to left upper extremity and numbness. Sheri Ruiz underwent MRI in Summer 2025. We do not have these records. Sheri Ruiz was not satisfied with the care Sheri Ruiz received and is asking for an new referral.  Immunizations: -Tetanus: Completed in 2024 -Influenza: Declines influenza vaccine.   Diet: Fair diet.  Exercise: No regular exercise.  Eye exam: Completed year ago  Dental exam: Completed years ago   Pap Smear: Completed in December 2023 Mammogram: Never completed    BP Readings from Last 3 Encounters:  08/15/24 110/64  07/21/23 108/70  02/25/21 108/68      Review of Systems  Constitutional:  Negative for unexpected weight change.  HENT:  Negative for rhinorrhea.   Respiratory:  Negative for cough and shortness of breath.   Cardiovascular:  Negative for chest pain.  Gastrointestinal:  Negative for constipation and diarrhea.  Genitourinary:  Negative for difficulty urinating and menstrual problem.  Musculoskeletal:  Positive for arthralgias and neck pain.  Skin:  Negative for rash.  Allergic/Immunologic: Negative for environmental allergies.  Neurological:  Negative for dizziness and headaches.  Psychiatric/Behavioral:  The patient is nervous/anxious.          Past Medical History:  Diagnosis Date   Abnormal Pap smear of cervix    Depression    Dysplastic nevus 11/15/2023   left mid to low back paraspinal severe atypia close to margin may need  additional treatment if recurrent  recheck at next follow up   Hypokalemia    Hypomagnesemia    LGSIL (low grade squamous intraepithelial dysplasia)    Migraine    Oral herpes    Preterm delivery     Social History   Socioeconomic History   Marital status: Married    Spouse name: Not on file   Number of children: Not on file   Years of education: Not on file   Highest education level: Associate degree: occupational, Scientist, product/process development, or vocational program  Occupational History   Not on file  Tobacco Use   Smoking status: Never   Smokeless tobacco: Never  Substance and Sexual Activity   Alcohol use: No    Alcohol/week: 0.0 standard drinks of alcohol   Drug use: No   Sexual activity: Yes    Birth control/protection: None  Other Topics Concern   Not on file  Social History Narrative   Married.   2 children.   Works as a Social worker.   Enjoys spending time with family.    Social Drivers of Corporate investment banker Strain: Low Risk  (07/12/2023)   Overall Financial Resource Strain (CARDIA)    Difficulty of Paying Living Expenses: Not very hard  Food Insecurity: No Food Insecurity (07/12/2023)   Hunger Vital Sign    Worried About Running Out of Food in the Last Year: Never true    Ran Out of Food in the Last Year: Never true  Transportation Needs: No Transportation  Needs (07/12/2023)   PRAPARE - Administrator, Civil Service (Medical): No    Lack of Transportation (Non-Medical): No  Physical Activity: Unknown (07/12/2023)   Exercise Vital Sign    Days of Exercise per Week: 0 days    Minutes of Exercise per Session: Not on file  Stress: Stress Concern Present (07/12/2023)   [employer] of Occupational Health - Occupational Stress Questionnaire    Feeling of Stress : Very much  Social Connections: Socially Integrated (07/12/2023)   Social Connection and Isolation Panel    Frequency of Communication with Friends and Family: More than three times a week    Frequency  of Social Gatherings with Friends and Family: Once a week    Attends Religious Services: More than 4 times per year    Active Member of [employer] or Organizations: Yes    Attends Banker Meetings: 1 to 4 times per year    Marital Status: Married  Catering manager Violence: Not on file    Past Surgical History:  Procedure Laterality Date   COLPOSCOPY     TONSILLECTOMY      Family History  Problem Relation Age of Onset   Transient ischemic attack Mother    Hypertension Maternal Grandmother    Clotting disorder Maternal Grandmother     Allergies  Allergen Reactions   Amoxicillin Rash and Nausea And Vomiting    Other reaction(s): GI Intolerance, Nausea And Vomiting    Current Outpatient Medications on File Prior to Visit  Medication Sig Dispense Refill   clindamycin  (CLEOCIN -T) 1 % lotion Apply topically daily. 60 mL 6   escitalopram  (LEXAPRO ) 10 MG tablet TAKE 1 TABLET (10 MG TOTAL) BY MOUTH DAILY. FOR ANXIETY 30 tablet 0   mupirocin  ointment (BACTROBAN ) 2 % Apply to sores on the face and inside the nose TID x 2 weeks. After two weeks decrease use to inside the nose on the weekends only. 22 g 2   tiZANidine (ZANAFLEX) 4 MG capsule Take 4 mg by mouth 3 (three) times daily as needed for muscle spasms.     valACYclovir  (VALTREX ) 1000 MG tablet Take 2 tablets twice daily for one day as needed for outbreaks. 12 tablet 0   No current facility-administered medications on file prior to visit.    BP 110/64   Pulse 72   Temp (!) 97.3 F (36.3 C) (Temporal)   Ht 5' 3.25 (1.607 m)   Wt 177 lb (80.3 kg)   LMP 08/09/2024   SpO2 100%   BMI 31.11 kg/m  Objective:   Physical Exam HENT:     Right Ear: Tympanic membrane and ear canal normal.     Left Ear: Tympanic membrane and ear canal normal.  Eyes:     Pupils: Pupils are equal, round, and reactive to light.  Cardiovascular:     Rate and Rhythm: Normal rate and regular rhythm.  Pulmonary:     Effort: Pulmonary effort  is normal.     Breath sounds: Normal breath sounds.  Abdominal:     General: Bowel sounds are normal.     Palpations: Abdomen is soft.     Tenderness: There is no abdominal tenderness.  Musculoskeletal:        General: Normal range of motion.     Cervical back: Neck supple.  Skin:    General: Skin is warm and dry.  Neurological:     Mental Status: Sheri Ruiz is alert and oriented to person, place, and time.  Cranial Nerves: No cranial nerve deficit.     Deep Tendon Reflexes:     Reflex Scores:      Patellar reflexes are 2+ on the right side and 2+ on the left side. Psychiatric:        Mood and Affect: Mood normal.     Physical Exam        Assessment & Plan:  Preventative health care Assessment & Plan: Immunizations UTD. Declines influenza vaccine.  Pap smear UTD. Due December 2026 Mammogram due, orders placed.  Discussed the importance of a healthy diet and regular exercise in order for weight loss, and to reduce the risk of further co-morbidity.  Exam stable. Labs pending.  Follow up in 1 year for repeat physical.    GAD (generalized anxiety disorder) Assessment & Plan: Controlled.  Continue Lexapro  10 mg daily. Referral placed for therapy.  Orders: -     Ambulatory referral to Psychology  Herpes labialis Assessment & Plan: Controlled.  Continue Valtrexx 2000 mg BID x 1 day   Migraine without aura and without status migrainosus, not intractable Assessment & Plan: Controlled.  Continue sumatriptan  50 mg PRN. Refill provided.    Menstrual migraine with status migrainosus, not intractable Assessment & Plan: Controlled.  Continue sumatriptan  50 mg PRN. Refill provided.   Orders: -     SUMAtriptan  Succinate; Take 1 tablet by mouth at migraine onset. May repeat in 2 hours if headache persists or recurs.  Dispense: 10 tablet; Refill: 0  Chronic neck pain Assessment & Plan: Following with orthopedics.  New referral placed for second opinion per  patient request. Continue tizanidine as needed.  Orders: -     Ambulatory referral to Orthopedic Surgery  Screening mammogram for breast cancer -     3D Screening Mammogram, Left and Right; Future    Assessment and Plan Assessment & Plan         Comer MARLA Gaskins, NP    History of Present Illness

## 2024-09-09 ENCOUNTER — Encounter: Payer: Self-pay | Admitting: Physical Medicine and Rehabilitation

## 2024-09-09 ENCOUNTER — Ambulatory Visit: Admitting: Physical Medicine and Rehabilitation

## 2024-09-09 DIAGNOSIS — M5010 Cervical disc disorder with radiculopathy, unspecified cervical region: Secondary | ICD-10-CM

## 2024-09-09 DIAGNOSIS — M5412 Radiculopathy, cervical region: Secondary | ICD-10-CM | POA: Diagnosis not present

## 2024-09-09 DIAGNOSIS — R202 Paresthesia of skin: Secondary | ICD-10-CM | POA: Diagnosis not present

## 2024-09-09 NOTE — Progress Notes (Signed)
 Sheri Ruiz - 38 y.o. female MRN [id number]  Date of birth: 10-13-1986  Office Visit Note: Visit Date: 09/09/2024 PCP: Gretta Comer POUR, NP Referred by: Gretta Comer POUR, NP  Subjective: Chief Complaint  Patient presents with   Neck - Pain   HPI: Sheri Ruiz is a 38 y.o. female who comes in today per the request of Comer Gretta, NP for evaluation of chronic, worsening and severe left sided neck pain radiating to shoulder. Reports previous episode of left arm pain and numbness. Now reports numbness to tip of left thumb. Pain ongoing for several months, worsens with movement and activity. She describes pain as stiff and tight sensation, currently denies pain at this time. Some relief of pain with home exercise regimen, rest and use of medications. Some relief of pain with Gabapentin, Tizanidine, Meloxicam and oral Prednisone. He was previously evaluated by Dr. Frederic Don at [facility] in [place]. She did have cervical MRI imaging in June, however I am not able to view imaging or report. 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 Patient currently working full time as Interior and spatial designer. She denies focal weakness. No recent trauma or falls.      Review of Systems  Musculoskeletal:  Negative for myalgias and neck pain.  Neurological:  Negative for tingling, sensory change, focal weakness and weakness.  All other systems reviewed and are negative.  Otherwise per HPI.  Assessment & Plan: Visit Diagnoses:    ICD-10-CM   1. Radiculopathy, cervical region  M54.12     2. Cervical disc disorder with radiculopathy, unspecified cervical region  M50.10        Plan: Findings:  Chronic, worsening and severe left sided neck pain radiating to shoulder. Previous episode of left arm pain and numbness. She currently denies symptoms today. She continues with home exercise regimen and Tizanidine as needed. 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 No symptoms of cervical myelopathy. She has good strength to bilateral upper extremities today. We discussed treatment options in detail. Would consider performing cervical epidural steroid injection should her symptoms return. She can continue with Tizanidine as needed. I did place order for short course of formal physical therapy. I would like for patient to try and obtain CD of cervical MRI imaging. I also had her complete medical records release so that I can get MRI report faxed to our office. She has no questions at this time. I will see her back in 8 weeks post physical therapy. No red flag symptoms noted upon exam today.     Meds & Orders: No orders of the defined types were placed in this encounter.  No orders of the defined types were placed in this encounter.   Follow-up: Return for 8 week follow up post physical therapy.   Procedures: No procedures performed      Clinical History: No specialty comments available.   She reports that she has never smoked. She has never used smokeless tobacco. No results for input(s): HGBA1C, LABURIC in the last 8760 hours.  Objective:  VS:  HT:    WT:   BMI:     BP:   HR: bpm  TEMP: ( )  RESP:  Physical Exam Vitals and nursing note reviewed.  HENT:     Head: Normocephalic and atraumatic.     Right Ear: External ear normal.     Left Ear:  External ear normal.     Nose: Nose normal.     Mouth/Throat:     Mouth: Mucous membranes are moist.  Eyes:     Extraocular Movements: Extraocular movements intact.  Cardiovascular:     Rate and Rhythm: Normal rate.     Pulses: Normal pulses.  Pulmonary:     Effort: Pulmonary effort is normal.  Abdominal:     General: Abdomen is flat. There is distension.  Musculoskeletal:        General: Normal range of motion.     Cervical back: Normal range of motion.     Comments: No discomfort noted with flexion, extension and  side-to-side rotation. Patient has good strength in the upper extremities including 5 out of 5 strength in wrist extension, long finger flexion and APB. Shoulder range of motion is full bilaterally without any sign of impingement. There is no atrophy of the hands intrinsically. Sensation intact bilaterally. Negative Hoffman's sign. Negative Spurling's sign.     Skin:    General: Skin is warm and dry.     Capillary Refill: Capillary refill takes less than 2 seconds.  Neurological:     General: No focal deficit present.     Mental Status: She is alert and oriented to person, place, and time.  Psychiatric:        Mood and Affect: Mood normal.        Behavior: Behavior normal.     Ortho Exam  Imaging: No results found.  Past Medical/Family/Surgical/Social History: Medications & Allergies reviewed per EMR, new medications updated. Patient Active Problem List   Diagnosis Date Noted   Chronic neck pain 08/15/2024   Nasal sore 07/21/2023   Preventative health care 02/25/2021   SVT (supraventricular tachycardia) 01/15/2017   Elevated troponin    GAD (generalized anxiety disorder) 07/20/2016   Migraines 07/20/2016   Herpes labialis 07/20/2016   Past Medical History:  Diagnosis Date   Abnormal Pap smear of cervix    Depression    Dysplastic nevus 11/15/2023   left mid to low back paraspinal severe atypia close to margin may need additional treatment if recurrent  recheck at next follow up   Hypokalemia    Hypomagnesemia    LGSIL (low grade squamous intraepithelial dysplasia)    Migraine    Oral herpes    Preterm delivery    Family History  Problem Relation Age of Onset   Transient ischemic attack Mother    Hypertension Maternal Grandmother    Clotting disorder Maternal Grandmother    Past Surgical History:  Procedure Laterality Date   COLPOSCOPY     TONSILLECTOMY     Social History   Occupational History   Not on file  Tobacco Use   Smoking status: Never   Smokeless  tobacco: Never  Substance and Sexual Activity   Alcohol use: No    Alcohol/week: 0.0 standard drinks of alcohol   Drug use: No   Sexual activity: Yes    Birth control/protection: None

## 2024-09-09 NOTE — Progress Notes (Signed)
 Pain Scale   Average Pain 2 Patient advising she has chronic neck pain radiating to left shoulder and at times to the right shoulder.        +Driver, -BT, -Dye Allergies.

## 2024-09-17 ENCOUNTER — Other Ambulatory Visit: Payer: Self-pay | Admitting: Primary Care

## 2024-09-17 DIAGNOSIS — G43821 Menstrual migraine, not intractable, with status migrainosus: Secondary | ICD-10-CM

## 2024-10-07 ENCOUNTER — Encounter: Payer: Self-pay | Admitting: Radiology

## 2024-11-04 ENCOUNTER — Ambulatory Visit: Admitting: Physical Medicine and Rehabilitation

## 2024-11-05 ENCOUNTER — Ambulatory Visit: Admitting: Physical Medicine and Rehabilitation

## 2024-12-04 ENCOUNTER — Telehealth: Admitting: Nurse Practitioner

## 2024-12-04 DIAGNOSIS — J0140 Acute pansinusitis, unspecified: Secondary | ICD-10-CM | POA: Diagnosis not present

## 2024-12-04 DIAGNOSIS — R051 Acute cough: Secondary | ICD-10-CM | POA: Diagnosis not present

## 2024-12-04 MED ORDER — BENZONATATE 100 MG PO CAPS: 100.0000 mg | ORAL_CAPSULE | Freq: Three times a day (TID) | ORAL | 0 refills | Status: AC | PRN

## 2024-12-04 MED ORDER — DOXYCYCLINE HYCLATE 100 MG PO TABS: 100.0000 mg | ORAL_TABLET | Freq: Two times a day (BID) | ORAL | 0 refills | Status: AC

## 2024-12-04 NOTE — Progress Notes (Signed)
 We are sorry that you are not feeling well.  Here is how we plan to help!  Based on your presentation I believe you most likely have A cough due to bacteria.  When patients have a fever and a productive cough with a change in color or increased sputum production, we are concerned about bacterial bronchitis.  If left untreated it can progress to pneumonia.  If your symptoms do not improve with your treatment plan it is important that you contact your provider.   I have prescribed Doxycycline 100 mg twice a day for 7 days     In addition you may use A prescription cough medication called Tessalon Perles 100mg . You may take 1-2 capsules every 8 hours as needed for your cough.    From your responses in the eVisit questionnaire you describe inflammation in the upper respiratory tract which is causing a significant cough.  This is commonly called Bronchitis and has four common causes:   Allergies Viral Infections Acid Reflux Bacterial Infection Allergies, viruses and acid reflux are treated by controlling symptoms or eliminating the cause. An example might be a cough caused by taking certain blood pressure medications. You stop the cough by changing the medication. Another example might be a cough caused by acid reflux. Controlling the reflux helps control the cough.  USE OF BRONCHODILATOR (RESCUE) INHALERS: There is a risk from using your bronchodilator too frequently.  The risk is that over-reliance on a medication which only relaxes the muscles surrounding the breathing tubes can reduce the effectiveness of medications prescribed to reduce swelling and congestion of the tubes themselves.  Although you feel brief relief from the bronchodilator inhaler, your asthma may actually be worsening with the tubes becoming more swollen and filled with mucus.  This can delay other crucial treatments, such as oral steroid medications. If you need to use a bronchodilator inhaler daily, several times per day, you  should discuss this with your provider.  There are probably better treatments that could be used to keep your asthma under control.     HOME CARE Only take medications as instructed by your medical team. Complete the entire course of an antibiotic. Drink plenty of fluids and get plenty of rest. Avoid close contacts especially the very young and the elderly Cover your mouth if you cough or cough into your sleeve. Always remember to wash your hands A steam or ultrasonic humidifier can help congestion.   GET HELP RIGHT AWAY IF: You develop worsening fever. You become short of breath You cough up blood. Your symptoms persist after you have completed your treatment plan MAKE SURE YOU  Understand these instructions. Will watch your condition. Will get help right away if you are not doing well or get worse.  Your e-visit answers were reviewed by a board certified advanced clinical practitioner to complete your personal care plan.  Depending on the condition, your plan could have included both over the counter or prescription medications. If there is a problem please reply  once you have received a response from your provider. Your safety is important to us .  If you have drug allergies check your prescription carefully.    You can use MyChart to ask questions about todays visit, request a non-urgent call back, or ask for a work or school excuse for 24 hours related to this e-Visit. If it has been greater than 24 hours you will need to follow up with your provider, or enter a new e-Visit to address those concerns.  You will get an e-mail in the next two days asking about your experience.  I hope that your e-visit has been valuable and will speed your recovery. Thank you for using e-visits.   I have spent 5 minutes in review of e-visit questionnaire, review and updating patient chart, medical decision making and response to patient.   Lauraine Kitty, FNP
